# Patient Record
Sex: Female | Born: 1990 | Hispanic: Yes | Marital: Married | Smoking: Never smoker
Health system: Southern US, Community
[De-identification: ages and names within clinical notes are randomized; demographics above are authoritative.]

## PROBLEM LIST (undated history)

## (undated) ENCOUNTER — Inpatient Hospital Stay: Payer: Self-pay

---

## 2014-10-27 ENCOUNTER — Emergency Department
Admission: EM | Admit: 2014-10-27 | Discharge: 2014-10-27 | Disposition: A | Discharge status: Home / Self Care | Payer: Medicaid Other | Attending: Emergency Medicine | Admitting: Emergency Medicine

## 2014-10-27 ENCOUNTER — Emergency Department: Payer: Medicaid Other

## 2014-10-27 ENCOUNTER — Other Ambulatory Visit: Payer: Self-pay | Admitting: Advanced Practice Midwife

## 2014-10-27 DIAGNOSIS — O210 Mild hyperemesis gravidarum: Secondary | ICD-10-CM | POA: Diagnosis not present

## 2014-10-27 DIAGNOSIS — O219 Vomiting of pregnancy, unspecified: Secondary | ICD-10-CM

## 2014-10-27 DIAGNOSIS — Z3A09 9 weeks gestation of pregnancy: Secondary | ICD-10-CM | POA: Diagnosis not present

## 2014-10-27 LAB — URINALYSIS COMPLETE WITH MICROSCOPIC (ARMC ONLY)
Bilirubin Urine: NEGATIVE
Glucose, UA: NEGATIVE mg/dL
Hgb urine dipstick: NEGATIVE
LEUKOCYTES UA: NEGATIVE
NITRITE: NEGATIVE
Protein, ur: 100 mg/dL — AB
Specific Gravity, Urine: 1.03 (ref 1.005–1.030)
pH: 7 (ref 5.0–8.0)

## 2014-10-27 LAB — COMPREHENSIVE METABOLIC PANEL
ALT: 36 U/L (ref 14–54)
AST: 20 U/L (ref 15–41)
Albumin: 4.9 g/dL (ref 3.5–5.0)
Alkaline Phosphatase: 38 U/L (ref 38–126)
Anion gap: 10 (ref 5–15)
BILIRUBIN TOTAL: 1 mg/dL (ref 0.3–1.2)
BUN: 7 mg/dL (ref 6–20)
CO2: 28 mmol/L (ref 22–32)
Calcium: 9.7 mg/dL (ref 8.9–10.3)
Chloride: 99 mmol/L — ABNORMAL LOW (ref 101–111)
Creatinine, Ser: 0.53 mg/dL (ref 0.44–1.00)
GFR calc Af Amer: >60 mL/min (ref >60)
GFR calc non Af Amer: >60 mL/min (ref >60)
Glucose, Bld: 112 mg/dL — ABNORMAL HIGH (ref 65–99)
Potassium: 3.6 mmol/L (ref 3.5–5.1)
SODIUM: 137 mmol/L (ref 135–145)
Total Protein: 8.1 g/dL (ref 6.5–8.1)

## 2014-10-27 LAB — CBC
HCT: 40.9 % (ref 35.0–47.0)
HEMOGLOBIN: 14 g/dL (ref 12.0–16.0)
MCH: 30.8 pg (ref 26.0–34.0)
MCHC: 34.3 g/dL (ref 32.0–36.0)
MCV: 89.8 fL (ref 80.0–100.0)
Platelets: 222 10*3/uL (ref 150–440)
RBC: 4.56 MIL/uL (ref 3.80–5.20)
RDW: 12.8 % (ref 11.5–14.5)
WBC: 8.9 10*3/uL (ref 3.6–11.0)

## 2014-10-27 LAB — HCG, QUANTITATIVE, PREGNANCY: hCG, Beta Chain, Quant, S: 153476 m[IU]/mL — ABNORMAL HIGH (ref <5)

## 2014-10-27 LAB — MAGNESIUM: Magnesium: 1.9 mg/dL (ref 1.7–2.4)

## 2014-10-27 MED ORDER — SODIUM CHLORIDE 0.9 % IV BOLUS (SEPSIS)
1000.0000 mL | Freq: Once | INTRAVENOUS | Status: AC
  Administered 2014-10-27: 1000 mL via INTRAVENOUS

## 2014-10-27 MED ORDER — SODIUM CHLORIDE 0.9 % IV BOLUS (SEPSIS): 1000.0000 mL | INTRAVENOUS | Status: DC

## 2014-10-27 MED ORDER — ONDANSETRON HCL 4 MG/2ML IJ SOLN
4.0000 mg | INTRAMUSCULAR | Status: AC
  Administered 2014-10-27: 4 mg via INTRAVENOUS
  Filled 2014-10-27: qty 2

## 2014-10-27 MED ORDER — SODIUM CHLORIDE 0.9 % IV BOLUS (SEPSIS)
1000.0000 mL | INTRAVENOUS | Status: AC
  Administered 2014-10-27: 1000 mL via INTRAVENOUS

## 2014-10-27 MED ORDER — ONDANSETRON HCL 4 MG PO TABS: ORAL_TABLET | ORAL | Status: DC

## 2014-10-27 NOTE — Discharge Instructions (Signed)
Hiperemesis gravdica (Hyperemesis Gravidarum) La hiperemesis gravdica es una forma grave de nuseas y vmitos que ocurren durante el Psychiatristembarazo Es peor que las nuseas matutinas. Puede provocarle nuseas o vmitos todo el da, [address]urante varios das. Posiblemente provoque que no coma ni beba la cantidad suficiente de alimentos y lquidos. Generalmente ocurre durante la primera mitad (las primeras 20 semanas) de Psychiatristembarazo. En general mejora en la segunda mitad del embarazo. Pero en algunos casos continua durante todo el embarazo.  CAUSAS  La causa de esta afeccin no se conoce por completo, pero se cree que est relacionada con los Starwood Hotelscambios en las hormonas del organismo durante el [place] Mallembarazo. Se podra dar debido a un nivel alto de la hormona del Psychiatristembarazo o a un aumento de Stage managerestrgenos en el organismo.  SIGNOS Y SNTOMAS   Nuseas y vmitos intensos.  Nuseas no mejoran.  Vmitos que le impiden Comcastretener los alimentos.  Prdida de peso y de lquidos corporales (deshidratacin).  No tener deseos de comer ni tener agrado por los alimentos que antes disfrutaba. DIAGNSTICO  El mdico le preguntar acerca de sus sntomas y le har un examen fsico. Posiblemente tambin le indique anlisis de sangre y de [place] para asegurarse de que no haya otra causa del problema.  TRATAMIENTO  Es posible que nicamente necesite tomar algunos medicamentos para Wellsite geologistcontrolar el problema. Si los medicamentos no controlan las nuseas y los vmitos, recibir un Pharmacist, hospitaltratamiento en el hospital para prevenir la deshidratacin, un aumento de la acidez en la sangre (acidosis), la prdida de peso y las modificaciones electrolticas en el organismo que podran perjudicar al beb en gestacin (feto). Probablemente necesite recibir lquidos por va intravenosa (IV).  INSTRUCCIONES PARA EL CUIDADO EN EL HOGAR   Tome solo medicamentos de venta libre o recetados, segn las indicaciones del mdico.  Trate de comer algunas galletitas secas o tostadas  durante la Centuriamaana, antes de levantarse de la cama.  Evite los alimentos y los Electronics engineerolores que le desagradan.  Evite los alimentos grasos y muy condimentados.  Coma 5 o 6 comidas pequeas por da.  No beba mientras coma. Tome lquidos Altria Groupentre las comidas.  Para las colaciones, consuma alimentos ricos en protenas, como queso.  Coma o succione alimentos que contengan jengibre. El jengibre [employer] & Comejora las nuseas.  Evite preparar las comidas. El olor de la comida puede quitarle el apetito.  Evite los comprimidos de hierro y las multivitaminas que contengan hierro hasta despus del 3. o 4.mes de Psychiatristembarazo. No obstante, consulte con el mdico antes de dejar de tomar cualquier tipo de comprimido de hierro que le hayan recetado. SOLICITE ATENCIN MDICA SI:   El dolor abdominal aumenta.  Sufre un dolor intenso de Turkmenistancabeza.  Tiene problemas de visin.  Pierde peso. SOLICITE ATENCIN MDICA DE INMEDIATO SI:   No puede retener los lquidos.  Vomita sangre.  Tiene nuseas y vmitos constantes.  Se siente excesivamente dbil.  Siente sed extrema.  Tiene mareos o Newell Rubbermaiddesmayos.  Tiene fiebre o sntomas persistentes durante ms de 2a 3das.  Tiene fiebre y los sntomas empeoran repentinamente. ASEGRESE DE QUE:   Comprende estas instrucciones.  Controlar su afeccin.  Recibir ayuda de inmediato si no mejora o si empeora. Document Released: 03/06/2005 Document Revised: 03/11/2013 [facility] Patient Information 2015 [place]Care, [place]. This information is not intended to replace advice given to you by your health care provider. Make sure you discuss any questions you have with your health care provider.  Plan de alimentacin para la hiperemesis gravdica (Eating Plan for Hyperemesis  Gravidarum) °Los casos graves de hiperemesis gravídica pueden derivar en deshidratación y desnutrición. El plan de alimentación para la hiperemesis es una forma de disminuir los síntomas de náuseas y vómitos. A menudo  se utiliza con medicamentos recetados para controlar los síntomas.  °¿QUÉ PUEDO HACER PARA ALIVIAR MIS SÍNTOMAS? °Préstele atención a su cuerpo. Todas las personas son diferentes y tienen diferentes preferencias. Descubra qué funciona mejor para usted. Algunas de las siguientes opciones pueden ser de ayuda: °· Coma y beba lentamente. °· Realice 5 o 6 comidas pequeñas por día en vez de tres grandes. °· Consuma galletitas saladas antes de levantarse por la mañana. °· Los alimentos que contienen almidón normalmente se toleran bien, como cereales, tostadas, pan, papas, pasta, arroz y pretzels. °· El jengibre también es útil para evitar las náuseas. Añada ¼ de cucharadita de jengibre molido al té caliente o beba té de jengibre. °· Intente tomar jugo 100 % de frutas o una bebida de electrolitos. °· Continúe tomando las vitaminas prenatales según las indicaciones del médico. Si tiene problemas para tomas sus vitaminas prenatales, hable con su médico sobre las diferentes opciones. °· Incluya al menos una porción de proteínas con las comidas y las colaciones (como carne de vaca o de ave, frijoles, frutos secos, huevos o yogur). Intente comer una colación rica en proteínas antes de irse a dormir (como queso y galletas o medio sándwich de mantequilla de maní o pavo). °¿QUÉ DEBO EVITAR PARA REDUCIR MIS SÍNTOMAS? °Las siguientes opciones pueden ser de ayuda para reducir sus síntomas: °· Evite los alimentos con olores fuertes. Intente comer en áreas bien ventiladas libres de olores. °· Evite tomar agua u otras bebidas con las comidas. Intente no beber nada 30 minutos antes y después de las comidas. °· Evite beber más de una taza de líquidos a la vez. °· Evite las comidas fritas o con gran contenido de grasas, como salsas con manteca y crema. °· Evite los alimentos muy condimentados. °· Evite saltear comidas tanto como sea posible. Las náuseas pueden ser más intensas con el estómago vacío. Si no puede tolerar los alimentos en ese  momento, no se obligue. Intente chupar trozos de hielo u otros elementos congelados e ingiera las calorías más tarde. °· Evite recostarse durante las dos horas siguientes a comer. °Document Released: 06/22/2008 Document Revised: 03/11/2013 °ExitCare® Patient Information ©2015 ExitCare, LLC. This information is not intended to replace advice given to you by your health care provider. Make sure you discuss any questions you have with your health care provider. ° °

## 2014-10-27 NOTE — ED Notes (Signed)
Patient has been vomiting everyday for about a month and cannot keep anything down. Patient is pregnant about 10-12 weeks per bimanual exam at health department.

## 2014-10-27 NOTE — ED Notes (Signed)
Ginger ale and saltines given to patient for PO challenge.

## 2014-10-27 NOTE — ED Notes (Signed)
Patient tolerated PO Challenge. No N/V

## 2014-10-27 NOTE — ED Notes (Signed)
AAOx3.  Skin warm and dry.  NAD.  D/C home 

## 2014-10-27 NOTE — ED Notes (Signed)
AAOx3.  Skin warm and dry.  NAD 

## 2014-10-27 NOTE — ED Provider Notes (Addendum)
[facility] Emergency Department Provider Note  ____________________________________________  Time seen: Approximately 7:38 PM  I have reviewed the triage vital signs and the nursing notes.   HISTORY  Chief Complaint Nausea and Emesis During Pregnancy   HPI Melanie Dominguez is a 24 y.o. female with no significant past medical history.  She is approximately 10-[redacted] weeks pregnant based on her examination at the health Department but has not had an ultrasound.  This is her first pregnancy.  Results with persistent severe emesis.  She states she has been vomiting every day for about one month.  She has not had any medication during this time.  Reportedly as an outpatient she had ketones in her urine.  Her symptoms are severe.  She denies abdominal pain, vaginal bleeding, and dysuria.  She denies fever/chills.  History reviewed. No pertinent past medical history.  There are no active problems to display for this patient.   History reviewed. No pertinent past surgical history.  Current Outpatient Rx  Name  Route  Sig  Dispense  Refill  . ondansetron (ZOFRAN) 4 MG tablet      Take 1-2 tabs by mouth every 8 hours as needed for nausea/vomiting   30 tablet   0     Allergies Review of patient's allergies indicates no known allergies.  No family history on file.  Social History History  Substance Use Topics  . Smoking status: Never Smoker   . Smokeless tobacco: Never Used  . Alcohol Use: No    Review of Systems Constitutional: No fever/chills Eyes: No visual changes. ENT: No sore throat. Cardiovascular: Denies chest pain. Respiratory: Denies shortness of breath. Gastrointestinal: No abdominal pain.  Persistent vomiting for about 1 month.  No diarrhea.  No constipation. Genitourinary: Negative for dysuria. Musculoskeletal: Negative for back pain. Skin: Negative for rash. Neurological: Negative for headaches, focal weakness or numbness.  10-point  ROS otherwise negative.  ____________________________________________   PHYSICAL EXAM:  VITAL SIGNS: ED Triage Vitals  Enc Vitals Group     BP 10/27/14 1720 130/77 mmHg     Pulse Rate 10/27/14 1720 98     Resp 10/27/14 1720 16     Temp 10/27/14 1720 98 F (36.7 C)     Temp Source 10/27/14 1720 Oral     SpO2 10/27/14 1720 100 %     Weight 10/27/14 1720 130 lb (58.968 kg)     Height 10/27/14 1720 5' 1.02" (1.55 m)     Head Cir --      Peak Flow --      Pain Score --      Pain Loc --      Pain Edu? --      Excl. in GC? --     Constitutional: Alert and oriented. Well appearing and in no acute distress. Eyes: Conjunctivae are normal. PERRL. EOMI. Head: Atraumatic. Nose: No congestion/rhinnorhea. Mouth/Throat: Mucous membranes are moist.  Oropharynx non-erythematous. Neck: No stridor.   Cardiovascular: Normal rate, regular rhythm. Grossly normal heart sounds.  Good peripheral circulation. Respiratory: Normal respiratory effort.  No retractions. Lungs CTAB. Gastrointestinal: Soft and nontender. No distention. No abdominal bruits. No CVA tenderness. Musculoskeletal: No lower extremity tenderness nor edema.  No joint effusions. Neurologic:  Normal speech and language. No gross focal neurologic deficits are appreciated.  Skin:  Skin is warm, dry and intact. No rash noted. Psychiatric: Mood and affect are normal. Speech and behavior are normal.  ____________________________________________   LABS (all labs ordered are listed,  but only abnormal results are displayed)  Labs Reviewed  COMPREHENSIVE METABOLIC PANEL - Abnormal; Notable for the following:    Chloride 99 (*)    Glucose, Bld 112 (*)    All other components within normal limits  HCG, QUANTITATIVE, PREGNANCY - Abnormal; Notable for the following:    hCG, Beta Chain, Quant, [place] 119147 (*)    All other components within normal limits  URINALYSIS COMPLETEWITH MICROSCOPIC ([facility] ONLY) - Abnormal; Notable for the following:     Color, Urine YELLOW (*)    APPearance CLEAR (*)    Ketones, ur 2+ (*)    Protein, ur 100 (*)    Bacteria, UA RARE (*)    Squamous Epithelial / LPF 0-5 (*)    All other components within normal limits  CBC  MAGNESIUM   2+ ketones in urine ____________________________________________  EKG  Not indicated ____________________________________________  RADIOLOGY  US Ob Comp Less 14 Wks  10/27/2014   CLINICAL DATA:  Severe nausea and vomiting.  Early pregnancy.  EXAM: OBSTETRIC <14 WK ULTRASOUND  TECHNIQUE: Transabdominal ultrasound was performed for evaluation of the gestation as well as the maternal uterus and adnexal regions.  COMPARISON:  None.  FINDINGS: Intrauterine gestational sac: Visualized/normal in shape.  Yolk sac:  Present  Embryo:  Present  Cardiac Activity: Present  Heart Rate: 173 bpm  CRL:   25  mm   9 w 1 d                  [place] EDC: 05/31/2015.  Maternal uterus/adnexae: Physiologic appearance of the ovaries. Negative for subchorionic hemorrhage. No free fluid.  IMPRESSION: Uncomplicated single intrauterine pregnancy. Transabdominal scanning was performed. Patient refused transvaginal scanning.   Electronically Signed   By: Andreas Newport M.D.   On: 10/27/2014 21:31    ____________________________________________   PROCEDURES  Procedure(s) performed: None  Critical Care performed: No ____________________________________________   INITIAL IMPRESSION / ASSESSMENT AND PLAN / ED COURSE  Pertinent labs & imaging results that were available during my care of the patient were reviewed by me and considered in my medical decision making (see chart for details).  Patient is having persistent vomiting during early pregnancy consistent with hyperemesis.  However her vital signs are reassuring with only slight borderline tachycardia.  Her labs are unremarkable except for 2+ ketones in her urine.  She has no evidence of urinary tract infection.  I am providing 2 L of normal  saline IV fluid bolus.  I also obtained an ultrasound since she has not had imaging and I wanted to rule out the possibility of a molar pregnancy.  She has a single IUP at approximately [redacted] weeks gestation.  I updated the patient and her husband about all of these findings.  She is feeling better after IV Zofran in the emergency department.  We will try by mouth challenge I will prescribe her Zofran as an outpatient.  She will follow up either with the health department or with Dr. Dalbert Garnet at the [facility].  I gave her my usual customary return precautions.  ____________________________________________  FINAL CLINICAL IMPRESSION(S) / ED DIAGNOSES  Final diagnoses:  Nausea and vomiting during pregnancy prior to [redacted] weeks gestation  Hyperemesis gravidarum      NEW MEDICATIONS STARTED DURING THIS VISIT:  New Prescriptions   ONDANSETRON (ZOFRAN) 4 MG TABLET    Take 1-2 tabs by mouth every 8 hours as needed for nausea/vomiting     Loleta Rose, MD 10/27/14 2151  Kandee Keen  York Cerise, MD 10/27/14 2219

## 2014-10-29 ENCOUNTER — Ambulatory Visit: Payer: Self-pay

## 2014-12-09 ENCOUNTER — Observation Stay
Admission: EM | Admit: 2014-12-09 | Discharge: 2014-12-13 | Disposition: A | Discharge status: Home / Self Care | Payer: Medicaid Other | Attending: Obstetrics and Gynecology | Admitting: Obstetrics and Gynecology

## 2014-12-09 DIAGNOSIS — Z79899 Other long term (current) drug therapy: Secondary | ICD-10-CM | POA: Insufficient documentation

## 2014-12-09 DIAGNOSIS — O210 Mild hyperemesis gravidarum: Secondary | ICD-10-CM | POA: Diagnosis present

## 2014-12-09 DIAGNOSIS — R05 Cough: Secondary | ICD-10-CM | POA: Insufficient documentation

## 2014-12-09 DIAGNOSIS — N390 Urinary tract infection, site not specified: Secondary | ICD-10-CM | POA: Diagnosis not present

## 2014-12-09 DIAGNOSIS — R7989 Other specified abnormal findings of blood chemistry: Secondary | ICD-10-CM | POA: Insufficient documentation

## 2014-12-09 DIAGNOSIS — R17 Unspecified jaundice: Secondary | ICD-10-CM

## 2014-12-09 DIAGNOSIS — Z3A15 15 weeks gestation of pregnancy: Secondary | ICD-10-CM | POA: Insufficient documentation

## 2014-12-09 DIAGNOSIS — O211 Hyperemesis gravidarum with metabolic disturbance: Secondary | ICD-10-CM | POA: Diagnosis not present

## 2014-12-09 DIAGNOSIS — R1013 Epigastric pain: Secondary | ICD-10-CM | POA: Diagnosis not present

## 2014-12-09 DIAGNOSIS — R740 Nonspecific elevation of levels of transaminase and lactic acid dehydrogenase [LDH]: Secondary | ICD-10-CM | POA: Insufficient documentation

## 2014-12-09 DIAGNOSIS — R748 Abnormal levels of other serum enzymes: Secondary | ICD-10-CM

## 2014-12-09 DIAGNOSIS — E860 Dehydration: Secondary | ICD-10-CM | POA: Diagnosis not present

## 2014-12-09 DIAGNOSIS — E876 Hypokalemia: Secondary | ICD-10-CM

## 2014-12-09 DIAGNOSIS — O219 Vomiting of pregnancy, unspecified: Secondary | ICD-10-CM

## 2014-12-09 LAB — CBC
HCT: 45.8 % (ref 35.0–47.0)
Hemoglobin: 15.7 g/dL (ref 12.0–16.0)
MCH: 30.1 pg (ref 26.0–34.0)
MCHC: 34.3 g/dL (ref 32.0–36.0)
MCV: 87.5 fL (ref 80.0–100.0)
Platelets: 340 10*3/uL (ref 150–440)
RBC: 5.23 MIL/uL — ABNORMAL HIGH (ref 3.80–5.20)
RDW: 13 % (ref 11.5–14.5)
WBC: 13.5 10*3/uL — AB (ref 3.6–11.0)

## 2014-12-09 LAB — COMPREHENSIVE METABOLIC PANEL
ALBUMIN: 5 g/dL (ref 3.5–5.0)
ALT: 126 U/L — AB (ref 14–54)
AST: 77 U/L — AB (ref 15–41)
Alkaline Phosphatase: 54 U/L (ref 38–126)
Anion gap: 19 — ABNORMAL HIGH (ref 5–15)
BUN: 22 mg/dL — AB (ref 6–20)
CHLORIDE: 90 mmol/L — AB (ref 101–111)
CO2: 25 mmol/L (ref 22–32)
Calcium: 10.5 mg/dL — ABNORMAL HIGH (ref 8.9–10.3)
Creatinine, Ser: 0.92 mg/dL (ref 0.44–1.00)
GFR calc Af Amer: >60 mL/min (ref >60)
Glucose, Bld: 111 mg/dL — ABNORMAL HIGH (ref 65–99)
Potassium: 2.9 mmol/L — CL (ref 3.5–5.1)
Sodium: 134 mmol/L — ABNORMAL LOW (ref 135–145)
Total Bilirubin: 6.2 mg/dL — ABNORMAL HIGH (ref 0.3–1.2)
Total Protein: 8.9 g/dL — ABNORMAL HIGH (ref 6.5–8.1)

## 2014-12-09 LAB — URINALYSIS COMPLETE WITH MICROSCOPIC (ARMC ONLY)
Glucose, UA: NEGATIVE mg/dL
NITRITE: NEGATIVE
PH: 5 (ref 5.0–8.0)
PROTEIN: 30 mg/dL — AB
Specific Gravity, Urine: 1.012 (ref 1.005–1.030)

## 2014-12-09 LAB — BILIRUBIN, FRACTIONATED(TOT/DIR/INDIR)
BILIRUBIN INDIRECT: 2.7 mg/dL — AB (ref 0.3–0.9)
BILIRUBIN TOTAL: 5.8 mg/dL — AB (ref 0.3–1.2)
Bilirubin, Direct: 3.1 mg/dL — ABNORMAL HIGH (ref 0.1–0.5)

## 2014-12-09 LAB — LIPASE, BLOOD: Lipase: 74 U/L — ABNORMAL HIGH (ref 22–51)

## 2014-12-09 MED ORDER — ONDANSETRON HCL 4 MG/2ML IJ SOLN
INTRAMUSCULAR | Status: AC
  Administered 2014-12-09: 4 mg via INTRAVENOUS
  Filled 2014-12-09: qty 2

## 2014-12-09 MED ORDER — DEXTROSE-NACL 5-0.45 % IV SOLN
INTRAVENOUS | Status: DC
  Administered 2014-12-09: 18:00:00 via INTRAVENOUS

## 2014-12-09 MED ORDER — ONDANSETRON HCL 4 MG/2ML IJ SOLN
4.0000 mg | Freq: Once | INTRAMUSCULAR | Status: AC | PRN
  Administered 2014-12-09: 4 mg via INTRAVENOUS

## 2014-12-09 MED ORDER — METOCLOPRAMIDE HCL 5 MG/ML IJ SOLN
10.0000 mg | Freq: Once | INTRAMUSCULAR | Status: AC
  Administered 2014-12-09: 10 mg via INTRAVENOUS
  Filled 2014-12-09 (×2): qty 2

## 2014-12-09 MED ORDER — DEXTROSE-NACL 5-0.45 % IV SOLN
INTRAVENOUS | Status: DC
  Administered 2014-12-09 – 2014-12-10 (×2): via INTRAVENOUS

## 2014-12-09 MED ORDER — POTASSIUM CHLORIDE CRYS ER 20 MEQ PO TBCR
40.0000 meq | EXTENDED_RELEASE_TABLET | Freq: Once | ORAL | Status: AC
  Administered 2014-12-09: 40 meq via ORAL
  Filled 2014-12-09 (×2): qty 2

## 2014-12-09 MED ORDER — ONDANSETRON HCL 4 MG/2ML IJ SOLN: 4.0000 mg | Freq: Four times a day (QID) | INTRAMUSCULAR | Status: DC | PRN

## 2014-12-09 MED ORDER — METOCLOPRAMIDE HCL 5 MG/ML IJ SOLN: 10.0000 mg | Freq: Four times a day (QID) | INTRAMUSCULAR | Status: DC | PRN

## 2014-12-09 NOTE — ED Provider Notes (Signed)
[facility] Emergency Department Provider Note  ____________________________________________  Time seen: Approximately 5 PM  I have reviewed the triage vital signs and the nursing notes. Interpreter service is utilized,Jacqui  HISTORY  Chief Complaint Emesis and Abdominal Pain    HPI Melanie Dominguez is a 24 y.o. female who is a G1 P0 at [redacted] weeks pregnant who is presenting today with hyperemesis gravidarum from the [facility] health Department. The patient has lost 6 kg over the last several weeks. She has vomited up to 15 times per day. No bilious vomiting. No diarrhea. Also saying she has a cough over the last several days. No fevers at home. Denies any vaginal bleeding or discharge. Denies any abdominal pain at this time but says she was having some upper abdominal pain. Has been using Zofran but ran out of the past 8 days and vomiting has been worse. Said that even with the Zofran she was still having nausea and vomiting. Denies any burning with urination.   No past medical history on file.  There are no active problems to display for this patient.   No past surgical history on file.  Current Outpatient Rx  Name  Route  Sig  Dispense  Refill  . ondansetron (ZOFRAN) 4 MG tablet      Take 1-2 tabs by mouth every 8 hours as needed for nausea/vomiting   30 tablet   0     Allergies Review of patient's allergies indicates no known allergies.  No family history on file.  Social History Social History  Substance Use Topics  . Smoking status: Never Smoker   . Smokeless tobacco: Never Used  . Alcohol Use: No    Review of Systems Constitutional: No fever/chills Eyes: No visual changes. ENT: No sore throat. Cardiovascular: Denies chest pain. Respiratory: Denies shortness of breath. Gastrointestinal: No diarrhea.  No constipation. Genitourinary: Negative for dysuria. Musculoskeletal: Negative for back pain. Skin: Negative for  rash. Neurological: Negative for headaches, focal weakness or numbness.  10-point ROS otherwise negative.  ____________________________________________   PHYSICAL EXAM:  VITAL SIGNS: ED Triage Vitals  Enc Vitals Group     BP 12/09/14 1558 102/69 mmHg     Pulse Rate 12/09/14 1558 101     Resp 12/09/14 1558 18     Temp 12/09/14 1558 97.7 F (36.5 C)     Temp Source 12/09/14 1558 Oral     SpO2 12/09/14 1558 98 %     Weight 12/09/14 1607 116 lb (52.617 kg)     Height 12/09/14 1607 5' (1.524 m)     Head Cir --      Peak Flow --      Pain Score 12/09/14 1607 5     Pain Loc --      Pain Edu? --      Excl. in GC? --     Constitutional: Alert and oriented. Well appearing and in no acute distress. Eyes: Positive for scleral icterus. PERRL. EOMI. Head: Atraumatic. Nose: No congestion/rhinnorhea. Mouth/Throat: Mucous membranes are moist.  Oropharynx non-erythematous. Neck: No stridor.   Cardiovascular: Normal rate, regular rhythm. Grossly normal heart sounds.  Good peripheral circulation. Respiratory: Normal respiratory effort.  No retractions. Lungs CTAB. Gastrointestinal: Soft and nontender. No abdominal bruits. No CVA tenderness. Musculoskeletal: No lower extremity tenderness nor edema.  No joint effusions. Neurologic:  Normal speech and language. No gross focal neurologic deficits are appreciated. No gait instability. Skin:  Skin is warm, dry and intact. No rash noted.  Psychiatric: Mood and affect are normal. Speech and behavior are normal.  ____________________________________________   LABS (all labs ordered are listed, but only abnormal results are displayed)  Labs Reviewed  COMPREHENSIVE METABOLIC PANEL - Abnormal; Notable for the following:    Sodium 134 (*)    Potassium 2.9 (*)    Chloride 90 (*)    Glucose, Bld 111 (*)    BUN 22 (*)    Calcium 10.5 (*)    Total Protein 8.9 (*)    AST 77 (*)    ALT 126 (*)    Total Bilirubin 6.2 (*)    Anion gap 19 (*)     All other components within normal limits  CBC - Abnormal; Notable for the following:    WBC 13.5 (*)    RBC 5.23 (*)    All other components within normal limits  LIPASE, BLOOD - Abnormal; Notable for the following:    Lipase 74 (*)    All other components within normal limits  URINALYSIS COMPLETEWITH MICROSCOPIC ([facility] ONLY) - Abnormal; Notable for the following:    Color, Urine AMBER (*)    APPearance HAZY (*)    Bilirubin Urine 1+ (*)    Ketones, ur 1+ (*)    Hgb urine dipstick 1+ (*)    Protein, ur 30 (*)    Leukocytes, UA TRACE (*)    Bacteria, UA MANY (*)    Squamous Epithelial / LPF 0-5 (*)    All other components within normal limits   ____________________________________________  EKG   ____________________________________________  RADIOLOGY   ____________________________________________   PROCEDURES   ____________________________________________   INITIAL IMPRESSION / ASSESSMENT AND PLAN / ED COURSE  Pertinent labs & imaging results that were available during my care of the patient were reviewed by me and considered in my medical decision making (see chart for details).  ----------------------------------------- 5:36 PM on 12/09/2014 -----------------------------------------  Multiple electrolyte abnormalities. Discussed case with Dr.Staebler of OB/GYN who is in the emergency department examining the patient at this time and is recommending admission to the hospital. ____________________________________________   FINAL CLINICAL IMPRESSION(S) / ED DIAGNOSES  Acute hyperemesis gravidarum. Acute hypokalemia. Acute dehydration. Acute hyper limited anemia. Initial visit.    Myrna Blazer, MD 12/09/14 [phone]

## 2014-12-09 NOTE — ED Notes (Signed)
Dr. Seabron Spates notified of critical k-dur of 2.9

## 2014-12-09 NOTE — H&P (Signed)
Obstetrics & Gynecology Consult H&P    Consulting Department: Emergency Department  Consulting Physician: Orbie Pyo, MD   Consulting Question: Hyperemesis Gravidarum   History of Present Illness: Patient is a 24 y.o. G1P0 at 32w2dtoday 9 week [place] 05/31/2015 with hyperemesis.  16.4lbs weight loss since 11/20/2014, UA at [facility] with 4+ ketones, 3+ proteinuria, 3+ bilirubin with signs of dehydration and jaundice at her routine prenatal visit today.  The patient has had problems with nausea throughout the pregnancy but acute worsening over the past 8 days.  Reports 16 episodes of emesis yesterday, 8 episodes today.  Some epigastric pain, no RUQ or back pain.  Denies loose stools.  No fevers, no sick contacts.  Interestingly the patient does reports episodes of jaundice if she goes long periods fasting.  No vaginal bleeding, fetal movement, LOF.    Review of Systems:10 point review of systems  Past Medical History:  No past medical history on file.  Past Surgical History:  No past surgical history on file.  Obstetric History: G1P0  Family History:  No family history on file.  Social History:  Social History   Social History  . Marital Status: Single    Spouse Name: N/A  . Number of Children: N/A  . Years of Education: N/A   Occupational History  . Not on file.   Social History Main Topics  . Smoking status: Never Smoker   . Smokeless tobacco: Never Used  . Alcohol Use: No  . Drug Use: No  . Sexual Activity: Not on file   Other Topics Concern  . Not on file   Social History Narrative  . No narrative on file    Allergies:  No Known Allergies  Medications: Prior to Admission medications   Medication Sig Start Date End Date Taking? Authorizing Provider  ondansetron (ZOFRAN) 4 MG tablet Take 1-2 tabs by mouth every 8 hours as needed for nausea/vomiting 10/27/14   CHinda Kehr MD    Physical Exam Vitals: Blood pressure 102/69, pulse 101,  temperature 97.7 F (36.5 C), temperature source Oral, resp. rate 18, height 5' (1.524 m), weight 52.617 kg (116 lb), last menstrual period 08/31/2014, SpO2 98 %.  General: NAD HEENT: Normocephalic, scleral icterus, slight thryomegaly Pulmonary: CTAB Cardiovascular: RRR Abdomen: Hyperactive BS, soft, non-tender, non-distended FHT 155 Genitourinary: deferred Extremities: no edema, decreased turgor pressure Neurologic: CN II-XII grossly intact Psychiatric: A&O x 3, mood appropriate, affect full  Labs: Results for orders placed or performed during the hospital encounter of 12/09/14 (from the past 72 hour(s))  Comprehensive metabolic panel     Status: Abnormal   Collection Time: 12/09/14  4:12 PM  Result Value Ref Range   Sodium 134 (L) 135 - 145 mmol/L   Potassium 2.9 (LL) 3.5 - 5.1 mmol/L    Comment: CRITICAL RESULT CALLED TO, READ BACK BY AND VERIFIED WITH OLIVIA BROOMER @ 1700 ON 12/09/2014 BY CAF    Chloride 90 (L) 101 - 111 mmol/L   CO2 25 22 - 32 mmol/L   Glucose, Bld 111 (H) 65 - 99 mg/dL   BUN 22 (H) 6 - 20 mg/dL   Creatinine, Ser 0.92 0.44 - 1.00 mg/dL   Calcium 10.5 (H) 8.9 - 10.3 mg/dL   Total Protein 8.9 (H) 6.5 - 8.1 g/dL   Albumin 5.0 3.5 - 5.0 g/dL   AST 77 (H) 15 - 41 U/L   ALT 126 (H) 14 - 54 U/L   Alkaline Phosphatase 54 38 -  126 U/L   Total Bilirubin 6.2 (H) 0.3 - 1.2 mg/dL   GFR calc non Af Amer >60 >60 mL/min   GFR calc Af Amer >60 >60 mL/min    Comment: (NOTE) The eGFR has been calculated using the CKD EPI equation. This calculation has not been validated in all clinical situations. eGFR's persistently <60 mL/min signify possible Chronic Kidney Disease.    Anion gap 19 (H) 5 - 15  CBC     Status: Abnormal   Collection Time: 12/09/14  4:12 PM  Result Value Ref Range   WBC 13.5 (H) 3.6 - 11.0 K/uL   RBC 5.23 (H) 3.80 - 5.20 MIL/uL   Hemoglobin 15.7 12.0 - 16.0 g/dL   HCT 45.8 35.0 - 47.0 %   MCV 87.5 80.0 - 100.0 fL   MCH 30.1 26.0 - 34.0 pg   MCHC  34.3 32.0 - 36.0 g/dL   RDW 13.0 11.5 - 14.5 %   Platelets 340 150 - 440 K/uL  Lipase, blood     Status: Abnormal   Collection Time: 12/09/14  4:12 PM  Result Value Ref Range   Lipase 74 (H) 22 - 51 U/L  Urinalysis complete, with microscopic ([facility] only)     Status: Abnormal   Collection Time: 12/09/14  4:12 PM  Result Value Ref Range   Color, Urine AMBER (A) YELLOW   APPearance HAZY (A) CLEAR   Glucose, UA NEGATIVE NEGATIVE mg/dL   Bilirubin Urine 1+ (A) NEGATIVE   Ketones, ur 1+ (A) NEGATIVE mg/dL   Specific Gravity, Urine 1.012 1.005 - 1.030   Hgb urine dipstick 1+ (A) NEGATIVE   pH 5.0 5.0 - 8.0   Protein, ur 30 (A) NEGATIVE mg/dL   Nitrite NEGATIVE NEGATIVE   Leukocytes, UA TRACE (A) NEGATIVE   RBC / HPF 0-5 0 - 5 RBC/hpf   WBC, UA 6-30 0 - 5 WBC/hpf   Bacteria, UA MANY (A) NONE SEEN   Squamous Epithelial / LPF 0-5 (A) NONE SEEN   Mucous PRESENT    Hyaline Casts, UA PRESENT     Imaging No results found.  Assessment: 24 y.o. G1P0 with hyperemesis gravidarum with electrolyte disturbance  Plan: 1) Hyperemesis gravidarum - IV hydration - IV antiemetics - potassium repleted in ER (71mq of K-dur) will recheck later this evening - will check TSH with repeat lytes this evening  2) Fetus - previable FHT 155  3) Elevated LFT's - maybe secondary severity of hyperemesis, underlying viral gastroenterology, vs potential Gilbert's disease - will recheck in AM after IV hydration and improvement in emesis - lipase pending but no RUQ pain/negative murphy's sign  4) FEN - NPO, D5 1/2 NS  5) Disposition - anticipated LOS 1-2 days, pending clinical and laboratory improvement

## 2014-12-09 NOTE — ED Notes (Addendum)
Pt ambulatory to triage with reports that she has been vomiting x 8 days- reports that she is approx [redacted] weeks pregnant. Was sent by clinic for hyperemesis gravidarum.

## 2014-12-10 ENCOUNTER — Observation Stay: Payer: Medicaid Other

## 2014-12-10 DIAGNOSIS — R1110 Vomiting, unspecified: Secondary | ICD-10-CM

## 2014-12-10 DIAGNOSIS — Z331 Pregnant state, incidental: Secondary | ICD-10-CM

## 2014-12-10 DIAGNOSIS — R17 Unspecified jaundice: Secondary | ICD-10-CM | POA: Diagnosis not present

## 2014-12-10 LAB — COMPREHENSIVE METABOLIC PANEL
ALBUMIN: 3.6 g/dL (ref 3.5–5.0)
ALK PHOS: 42 U/L (ref 38–126)
ALT: 98 U/L — ABNORMAL HIGH (ref 14–54)
ANION GAP: 7 (ref 5–15)
AST: 60 U/L — AB (ref 15–41)
BUN: 9 mg/dL (ref 6–20)
CALCIUM: 8.9 mg/dL (ref 8.9–10.3)
CO2: 27 mmol/L (ref 22–32)
Chloride: 99 mmol/L — ABNORMAL LOW (ref 101–111)
Creatinine, Ser: 0.47 mg/dL (ref 0.44–1.00)
GFR calc Af Amer: >60 mL/min (ref >60)
GFR calc non Af Amer: >60 mL/min (ref >60)
GLUCOSE: 120 mg/dL — AB (ref 65–99)
POTASSIUM: 2.4 mmol/L — AB (ref 3.5–5.1)
SODIUM: 133 mmol/L — AB (ref 135–145)
Total Bilirubin: 4.1 mg/dL — ABNORMAL HIGH (ref 0.3–1.2)
Total Protein: 6.5 g/dL (ref 6.5–8.1)

## 2014-12-10 LAB — HEPATIC FUNCTION PANEL
ALBUMIN: 3.6 g/dL (ref 3.5–5.0)
ALK PHOS: 39 U/L (ref 38–126)
ALT: 103 U/L — AB (ref 14–54)
AST: 70 U/L — ABNORMAL HIGH (ref 15–41)
Bilirubin, Direct: 1.9 mg/dL — ABNORMAL HIGH (ref 0.1–0.5)
Indirect Bilirubin: 1.6 mg/dL — ABNORMAL HIGH (ref 0.3–0.9)
TOTAL PROTEIN: 6.4 g/dL — AB (ref 6.5–8.1)
Total Bilirubin: 3.5 mg/dL — ABNORMAL HIGH (ref 0.3–1.2)

## 2014-12-10 LAB — TSH: TSH: 1.265 u[IU]/mL (ref 0.350–4.500)

## 2014-12-10 LAB — T4, FREE: Free T4: 1.09 ng/dL (ref 0.61–1.12)

## 2014-12-10 MED ORDER — DEXTROSE 5 % IV SOLN
2.0000 g | Freq: Four times a day (QID) | INTRAVENOUS | Status: DC
  Administered 2014-12-10 – 2014-12-11 (×4): 2 g via INTRAVENOUS
  Filled 2014-12-10 (×9): qty 2

## 2014-12-10 MED ORDER — FAMOTIDINE IN NACL 20-0.9 MG/50ML-% IV SOLN
20.0000 mg | Freq: Two times a day (BID) | INTRAVENOUS | Status: DC
  Administered 2014-12-10 – 2014-12-11 (×3): 20 mg via INTRAVENOUS
  Filled 2014-12-10 (×3): qty 50

## 2014-12-10 MED ORDER — KCL IN DEXTROSE-NACL 30-5-0.45 MEQ/L-%-% IV SOLN
INTRAVENOUS | Status: DC
  Administered 2014-12-10 – 2014-12-11 (×4): via INTRAVENOUS
  Filled 2014-12-10 (×12): qty 1000

## 2014-12-10 NOTE — Consult Note (Signed)
[facility]  [address] [place], [place] [address] Phone: [phone] Fax : [phone]  Consultation  Referring Provider:     No ref. provider found Primary Care Physician:  Darlina Rumpf, MD Primary Gastroenterologist:  None         Reason for Consultation:     Abnormal liver enzymes  Date of Admission:  12/09/2014 Date of Consultation:  12/10/2014         HPI:   Melanie Dominguez is a 24 y.o. female who was admitted with hyperemesis. The patient is [redacted] weeks pregnant and was admitted for hyperemesis. The patient has had a 16 pound weight loss since September 2. The patient was noted to have ketones protein and bilirubin in her urine and was dehydrated. The patient was admitted to the hospital and found to have abnormal liver enzymes. On admission the patient's bilirubin was 6 which came down to 5.8 yesterday was down to 4.1 today. One month ago the liver enzymes were normal. The patient's AST and ALT were 77 and 126 respectively yesterday with her AST of 60 with a ALT of 98 today. The patient denies any sick contacts. She also denies any anti-inflammatory medication or Tylenol intake. She denies any joint pain fevers or chills. She does report some abdominal pain related to her nausea and vomiting. She also reports that she is feeling much better since she was given medication for nausea and vomiting.  History reviewed. No pertinent past medical history.  History reviewed. No pertinent past surgical history.  Prior to Admission medications   Medication Sig Start Date End Date Taking? Authorizing Provider  ondansetron (ZOFRAN) 4 MG tablet Take 1-2 tabs by mouth every 8 hours as needed for nausea/vomiting 10/27/14  Yes Loleta Rose, MD    History reviewed. No pertinent family history.   Social History  Substance Use Topics  . Smoking status: Never Smoker   . Smokeless tobacco: Never Used  . Alcohol Use: No    Allergies as of 12/09/2014  . (No Known  Allergies)    Review of Systems:    All systems reviewed and negative except where noted in HPI.   Physical Exam:  Vital signs in last 24 hours: Temp:  [97.9 F (36.6 C)-98.6 F (37 C)] 98.4 F (36.9 C) (09/22 1655) Pulse Rate:  [82-101] 88 (09/22 1655) Resp:  [16-18] 16 (09/22 1655) BP: (104-136)/(59-90) 124/84 mmHg (09/22 1655) SpO2:  [95 %-100 %] 99 % (09/22 1100) Weight:  [117 lb (53.071 kg)] 117 lb (53.071 kg) (09/21 1945)   General:   Pleasant, cooperative in NAD Head:  Normocephalic and atraumatic. Eyes:   No icterus.   Conjunctiva pink. PERRLA. Ears:  Normal auditory acuity. Neck:  Supple; no masses or thyroidomegaly Lungs: Respirations even and unlabored. Lungs clear to auscultation bilaterally.   No wheezes, crackles, or rhonchi.  Heart:  Regular rate and rhythm;  Without murmur, clicks, rubs or gallops Abdomen:  Soft, nondistended, nontender. Normal bowel sounds. No appreciable masses or hepatomegaly.  No rebound or guarding. Gravid Rectal:  Not performed. Msk:  Symmetrical without gross deformities.  Strength  Extremities:  Without edema, cyanosis or clubbing. Neurologic:  Alert and oriented x3;  grossly normal neurologically. Skin:  Intact without significant lesions or rashes. Cervical Nodes:  No significant cervical adenopathy. Psych:  Alert and cooperative. Normal affect.  LAB RESULTS:  Recent Labs  12/09/14 1612  WBC 13.5*  HGB 15.7  HCT 45.8  PLT 340   BMET  Recent Labs  12/09/14 1612 12/10/14 0629  NA 134* 133*  K 2.9* 2.4*  CL 90* 99*  CO2 25 27  GLUCOSE 111* 120*  BUN 22* 9  CREATININE 0.92 0.47  CALCIUM 10.5* 8.9   LFT  Recent Labs  12/09/14 1623 12/10/14 0629  PROT  --  6.5  ALBUMIN  --  3.6  AST  --  60*  ALT  --  98*  ALKPHOS  --  42  BILITOT 5.8* 4.1*  BILIDIR 3.1*  --   IBILI 2.7*  --    PT/INR No results for input(s): LABPROT, INR in the last 72 hours.  STUDIES: US Abdomen Complete  12/10/2014   CLINICAL DATA:   Fifteen week pregnant patient with epigastric abdominal pain, elevated LFTs and nausea/ vomiting.  EXAM: ULTRASOUND ABDOMEN COMPLETE  COMPARISON:  None.  FINDINGS: Gallbladder: Homogeneous echogenicity throughout gallbladder lumen suggestive of sludge. No gallbladder wall thickening. No pericholecystic fluid. Negative sonographic Murphy sign.  Common bile duct: Diameter: 2 mm  Liver: No focal lesion identified. Within normal limits in parenchymal echogenicity.  IVC: No abnormality visualized.  Pancreas: Visualized portion unremarkable.  Spleen: Size and appearance within normal limits.  Right Kidney: Length: 10.5 cm. Echogenicity within normal limits. No mass or hydronephrosis visualized.  Left Kidney: Length: 10.0 cm. Echogenicity within normal limits. No mass or hydronephrosis visualized.  Abdominal aorta: No aneurysm visualized.  Other findings: None.  IMPRESSION: The internal contents of the gallbladder are homogeneously isoechoic to the liver suggestive of sludge filled gallbladder. No definite evidence for gallbladder wall thickening, pericholecystic fluid or sonographic Murphy's sign. No ancillary findings to suggest acute cholecystitis.   Electronically Signed   By: Annia Belt M.D.   On: 12/10/2014 11:03      Impression / Plan:   Melanie Dominguez is a 24 y.o. y/o female who comes in with hyperemesis and is pregnant. The patient had abnormal liver enzymes that are now coming down. The bilirubin has gone from 6-4 and her LFTs are also coming down. The patient phosphatase head is not elevated and imaging of the liver did not show any cause for her abnormal liver enzymes. There was some sludge in the gallbladder with no sign of thickened gallbladder wall or evidence of cholecystitis. The patient will have her blood sent off for possible causes of abnormal liver enzymes including ANA, AMA SMA ceruloplasmin CMV EBV and ferritin. The patient has been explained the plan and agrees with it.  Thank you  for involving me in the care of this patient.        Darlina Rumpf, MD  12/10/2014, 5:22 PM   Note: This dictation was prepared with Dragon dictation along with smaller phrase technology. Any transcriptional errors that result from this process are unintentional.

## 2014-12-10 NOTE — Progress Notes (Signed)
Antepartum Note  24 year old G1 P0 at 19wk 3d by a 9 week ultrasound admitted yesterday with hyperemesis. She reports no further vomiting after receiving IV antiemetics. Voided x2 since admission. Has had epigastric pain x 4 days that started after her vomiting started. Rated the pain as a 7/10 yesterday and is now a 4/10. Denies any past hx of GB/ liver disease, or pancreatitis. Last BM 1 day ago, appeared normal color  O: BP 115/70 mmHg  Pulse 82  Temp(Src) 98.1 F (36.7 C) (Oral)  Resp 18  Ht 5' (1.524 m)  Wt 117 lb (53.071 kg)  BMI 22.85 kg/m2  SpO2 100%  LMP 08/31/2014   Results for orders placed or performed during the hospital encounter of 12/09/14 (from the past 24 hour(s))  Comprehensive metabolic panel     Status: Abnormal   Collection Time: 12/09/14  4:12 PM  Result Value Ref Range   Sodium 134 (L) 135 - 145 mmol/L   Potassium 2.9 (LL) 3.5 - 5.1 mmol/L   Chloride 90 (L) 101 - 111 mmol/L   CO2 25 22 - 32 mmol/L   Glucose, Bld 111 (H) 65 - 99 mg/dL   BUN 22 (H) 6 - 20 mg/dL   Creatinine, Ser 1.61 0.44 - 1.00 mg/dL   Calcium 09.6 (H) 8.9 - 10.3 mg/dL   Total Protein 8.9 (H) 6.5 - 8.1 g/dL   Albumin 5.0 3.5 - 5.0 g/dL   AST 77 (H) 15 - 41 U/L   ALT 126 (H) 14 - 54 U/L   Alkaline Phosphatase 54 38 - 126 U/L   Total Bilirubin 6.2 (H) 0.3 - 1.2 mg/dL   GFR calc non Af Amer >60 >60 mL/min   GFR calc Af Amer >60 >60 mL/min   Anion gap 19 (H) 5 - 15  CBC     Status: Abnormal   Collection Time: 12/09/14  4:12 PM  Result Value Ref Range   WBC 13.5 (H) 3.6 - 11.0 K/uL   RBC 5.23 (H) 3.80 - 5.20 MIL/uL   Hemoglobin 15.7 12.0 - 16.0 g/dL   HCT 04.5 40.9 - 81.1 %   MCV 87.5 80.0 - 100.0 fL   MCH 30.1 26.0 - 34.0 pg   MCHC 34.3 32.0 - 36.0 g/dL   RDW 91.4 78.2 - 95.6 %   Platelets 340 150 - 440 K/uL  Lipase, blood     Status: Abnormal   Collection Time: 12/09/14  4:12 PM  Result Value Ref Range   Lipase 74 (H) 22 - 51 U/L  Urinalysis complete, with microscopic ([facility]  only)     Status: Abnormal   Collection Time: 12/09/14  4:12 PM  Result Value Ref Range   Color, Urine AMBER (A) YELLOW   APPearance HAZY (A) CLEAR   Glucose, UA NEGATIVE NEGATIVE mg/dL   Bilirubin Urine 1+ (A) NEGATIVE   Ketones, ur 1+ (A) NEGATIVE mg/dL   Specific Gravity, Urine 1.012 1.005 - 1.030   Hgb urine dipstick 1+ (A) NEGATIVE   pH 5.0 5.0 - 8.0   Protein, ur 30 (A) NEGATIVE mg/dL   Nitrite NEGATIVE NEGATIVE   Leukocytes, UA TRACE (A) NEGATIVE   RBC / HPF 0-5 0 - 5 RBC/hpf   WBC, UA 6-30 0 - 5 WBC/hpf   Bacteria, UA MANY (A) NONE SEEN   Squamous Epithelial / LPF 0-5 (A) NONE SEEN   Mucous PRESENT    Hyaline Casts, UA PRESENT   Bilirubin, fractionated(tot/dir/indir)  Status: Abnormal   Collection Time: 12/09/14  4:23 PM  Result Value Ref Range   Total Bilirubin 5.8 (H) 0.3 - 1.2 mg/dL   Bilirubin, Direct 3.1 (H) 0.1 - 0.5 mg/dL   Indirect Bilirubin 2.7 (H) 0.3 - 0.9 mg/dL  Comprehensive metabolic panel     Status: Abnormal   Collection Time: 12/10/14  6:29 AM  Result Value Ref Range   Sodium 133 (L) 135 - 145 mmol/L   Potassium 2.4 (LL) 3.5 - 5.1 mmol/L   Chloride 99 (L) 101 - 111 mmol/L   CO2 27 22 - 32 mmol/L   Glucose, Bld 120 (H) 65 - 99 mg/dL   BUN 9 6 - 20 mg/dL   Creatinine, Ser 4.09 0.44 - 1.00 mg/dL   Calcium 8.9 8.9 - 81.1 mg/dL   Total Protein 6.5 6.5 - 8.1 g/dL   Albumin 3.6 3.5 - 5.0 g/dL   AST 60 (H) 15 - 41 U/L   ALT 98 (H) 14 - 54 U/L   Alkaline Phosphatase 42 38 - 126 U/L   Total Bilirubin 4.1 (H) 0.3 - 1.2 mg/dL   GFR calc non Af Amer >60 >60 mL/min   GFR calc Af Amer >60 >60 mL/min   Anion gap 7 5 - 15  TSH     Status: None   Collection Time: 12/10/14  6:29 AM  Result Value Ref Range   TSH 1.265 0.350 - 4.500 uIU/mL  T4, free     Status: None   Collection Time: 12/10/14  6:29 AM  Result Value Ref Range   Free T4 1.09 0.61 - 1.12 ng/dL   General: appears comfortable, in NAD Heart: RRR with no murmur, ? Split S2 Abdomen: tenderness  to palpation of epigastric area, negative Murphy's sign, hypoactive bowel sounds, ND Extremities: No tenderness or inflammation Urine output=800 ml with a little over 1800 ml IV intake  A: Liver enzymes and bilirubin decreasing, but still elevated, lipase mildly elevated last night Hypokalemia (K+=2.4) Epigastric pain-improved Pyuria  P: 30 meq KCL added to IV Gastroenterology consult Abdominal ultrasound Ice chips Pepcid IV Mefoxin IV for possible UTI Urine culture Cheynne Virden, CNM

## 2014-12-11 DIAGNOSIS — R748 Abnormal levels of other serum enzymes: Secondary | ICD-10-CM | POA: Diagnosis not present

## 2014-12-11 DIAGNOSIS — R17 Unspecified jaundice: Secondary | ICD-10-CM | POA: Diagnosis not present

## 2014-12-11 LAB — BASIC METABOLIC PANEL
ANION GAP: 4 — AB (ref 5–15)
BUN: <5 mg/dL — ABNORMAL LOW (ref 6–20)
CALCIUM: 8.5 mg/dL — AB (ref 8.9–10.3)
CO2: 26 mmol/L (ref 22–32)
CREATININE: 0.59 mg/dL (ref 0.44–1.00)
Chloride: 106 mmol/L (ref 101–111)
Glucose, Bld: 97 mg/dL (ref 65–99)
Potassium: 3.1 mmol/L — ABNORMAL LOW (ref 3.5–5.1)
SODIUM: 136 mmol/L (ref 135–145)

## 2014-12-11 LAB — HEPATIC FUNCTION PANEL
ALT: 111 U/L — ABNORMAL HIGH (ref 14–54)
AST: 74 U/L — AB (ref 15–41)
Albumin: 3.2 g/dL — ABNORMAL LOW (ref 3.5–5.0)
Alkaline Phosphatase: 37 U/L — ABNORMAL LOW (ref 38–126)
BILIRUBIN DIRECT: 1.9 mg/dL — AB (ref 0.1–0.5)
BILIRUBIN INDIRECT: 1.3 mg/dL — AB (ref 0.3–0.9)
BILIRUBIN TOTAL: 3.2 mg/dL — AB (ref 0.3–1.2)
Total Protein: 5.7 g/dL — ABNORMAL LOW (ref 6.5–8.1)

## 2014-12-11 LAB — FERRITIN: Ferritin: 207 ng/mL (ref 11–307)

## 2014-12-11 MED ORDER — POTASSIUM CHLORIDE CRYS ER 20 MEQ PO TBCR
40.0000 meq | EXTENDED_RELEASE_TABLET | Freq: Three times a day (TID) | ORAL | Status: DC
  Administered 2014-12-11 – 2014-12-12 (×2): 40 meq via ORAL
  Filled 2014-12-11 (×2): qty 2

## 2014-12-11 MED ORDER — RANITIDINE HCL 150 MG/10ML PO SYRP: 150.0000 mg | ORAL_SOLUTION | Freq: Two times a day (BID) | ORAL | Status: DC

## 2014-12-11 MED ORDER — ONDANSETRON HCL 4 MG PO TABS: 8.0000 mg | ORAL_TABLET | Freq: Three times a day (TID) | ORAL | Status: DC | PRN

## 2014-12-11 MED ORDER — ONDANSETRON HCL 4 MG/2ML IJ SOLN: 4.0000 mg | Freq: Three times a day (TID) | INTRAMUSCULAR | Status: DC | PRN

## 2014-12-11 MED ORDER — PROMETHAZINE HCL 25 MG PO TABS: 25.0000 mg | ORAL_TABLET | Freq: Four times a day (QID) | ORAL | Status: DC | PRN

## 2014-12-11 NOTE — Progress Notes (Signed)
Subjective: Patient without complaints. Doing well. LFT's still elevated with the bili coming down. The alk phos continues to be normal. Blood work to look for  A cause still pending. Objective: Vital signs in last 24 hours: Filed Vitals:   12/11/14 0009 12/11/14 0428 12/11/14 0732 12/11/14 1156  BP: 106/60 109/60 111/69 109/59  Pulse: 87 82 87 89  Temp: 98.3 F (36.8 C) 97.7 F (36.5 C) 98.3 F (36.8 C) 98.8 F (37.1 C)  TempSrc: Oral Oral Oral Oral  Resp: _0 Height:      Weight:      SpO2: 100%  100%    Weight change: 4 lb 9.6 oz (2.087 kg)  Intake/Output Summary (Last 24 hours) at 12/11/14 1240 Last data filed at 12/11/14 0900  Gross per 24 hour  Intake 3117.34 ml  Output    900 ml  Net 2217.34 ml   BP 109/59 mmHg  Pulse 89  Temp(Src) 98.8 F (37.1 C) (Oral)  Resp 18  Ht 4' 11.5" (1.511 m)  Wt 120 lb 9.6 oz (54.704 kg)  BMI 23.96 kg/m2  SpO2 100%  LMP 08/31/2014  General Appearance:    Alert, cooperative, no distress, appears stated age  Head:    Normocephalic, without obvious abnormality, atraumatic  Eyes:    PERRL, conjunctiva/corneas clear, EOM's intact, fundi    benign, both eyes        Throat:   Lips, mucosa, and tongue normal; teeth and gums normal  Neck:   Supple, symmetrical, trachea midline, no adenopathy;    thyroid:  no enlargement/tenderness/nodules; no carotid   bruit or JVD     Lungs:     Clear to auscultation bilaterally, respirations unlabored  Chest Wall:    No tenderness or deformity   Heart:    Regular rate and rhythm, S1 and S2 normal, no murmur, rub   or gallop     Abdomen:     Soft, non-tender, bowel sounds active all four quadrants,    no masses, no organomegaly, gravid        Extremities:   Extremities normal, atraumatic, no cyanosis or edema  Pulses:   2+ and symmetric all extremities  Skin:   Skin color, texture, turgor normal, no rashes or lesions     Neurologic:   CNII-XII intact, normal strength, sensation and  reflexes    throughout   Lab Results: _1 @ Micro Results: Recent Results (from the past 240 hour(s))  Urine culture     Status: None (Preliminary result)   Collection Time: 12/09/14  4:12 PM  Result Value Ref Range Status   Specimen Description URINE, RANDOM  Final   Special Requests NONE  Final   Culture NO GROWTH < 24 HOURS  Final   Report Status PENDING  Incomplete   Studies/Results: US Abdomen Complete  12/10/2014   CLINICAL DATA:  Fifteen week pregnant patient with epigastric abdominal pain, elevated LFTs and nausea/ vomiting.  EXAM: ULTRASOUND ABDOMEN COMPLETE  COMPARISON:  None.  FINDINGS: Gallbladder: Homogeneous echogenicity throughout gallbladder lumen suggestive of sludge. No gallbladder wall thickening. No pericholecystic fluid. Negative sonographic Murphy sign.  Common bile duct: Diameter: 2 mm  Liver: No focal lesion identified. Within normal limits in parenchymal echogenicity.  IVC: No abnormality visualized.  Pancreas: Visualized portion unremarkable.  Spleen: Size and appearance within normal limits.  Right Kidney: Length: 10.5 cm. Echogenicity within normal limits. No mass or hydronephrosis visualized.  Left Kidney: Length: 10.0 cm. Echogenicity within normal limits. No  mass or hydronephrosis visualized.  Abdominal aorta: No aneurysm visualized.  Other findings: None.  IMPRESSION: The internal contents of the gallbladder are homogeneously isoechoic to the liver suggestive of sludge filled gallbladder. No definite evidence for gallbladder wall thickening, pericholecystic fluid or sonographic Murphy's sign. No ancillary findings to suggest acute cholecystitis.   Electronically Signed   By: Lovey Newcomer M.D.   On: 12/10/2014 11:03   Medications: I have reviewed the patient's current medications. Scheduled Meds: . famotidine (PEPCID) IV  20 mg Intravenous Q12H   Continuous Infusions: . dexrose 5 % and 0.45 % NaCl with KCl 30 mEq/L 75 mL/hr at 12/11/14 0920   PRN  Meds:.ondansetron (ZOFRAN) IV, ondansetron, promethazine Assessment/Plan: Active Problems:   Hyperemesis gravidarum with metabolic disturbance   Elevated bilirubin  Plan: Patient with increased LFT's. Multiple labs pending. Dr. Gustavo Lah will be following over the weekend. Bili coming down. Will follow with you.   Melanie Dominguez 12/11/2014, 12:40 PM

## 2014-12-11 NOTE — Progress Notes (Signed)
Daily Antepartum Note  Admission Date: 12/09/2014 Current Date: 12/11/2014  Melanie Dominguez is a 24 y.o. G1 @ 15/4 by 9wk u/s, HD#3, admitted for nausea, vomiting; pt diagnosed with abnormal liver enzymes on admission, hypokalemia  Pregnancy complicated by: nothing   Overnight/24hr events:  Pt states she had no nausea, vomiting and was able to eat and drink fine  Subjective:  As above. No fevers, chills, preterm labor s/s  Objective:    Current Vital Signs 24h Vital Sign Ranges  T 98.3 F (36.8 C) Temp  Avg: 98.3 F (36.8 C)  Min: 97.7 F (36.5 C)  Max: 98.6 F (37 C)  BP 111/69 mmHg BP  Min: 106/60  Max: 136/90  HR 87 Pulse  Avg: 85.6  Min: 75  Max: 96  RR 18 Resp  Avg: 18  Min: 16  Max: 20  SaO2 100 % Not Delivered SpO2  Avg: 99.7 %  Min: 99 %  Max: 100 %       24 Hour I/O Current Shift I/O  Time Ins Outs 09/22 0701 - 09/23 0700 In: 4814.8 [P.O.:240; I.V.:4274.8] Out: 2150 [Urine:2150]     Patient Vitals for the past 24 hrs:  BP Temp Temp src Pulse Resp SpO2 Height Weight  12/11/14 0732 111/69 mmHg 98.3 F (36.8 C) Oral 87 18 100 % - -  12/11/14 0428 109/60 mmHg 97.7 F (36.5 C) Oral 82 18 - - -  12/11/14 0009 106/60 mmHg 98.3 F (36.8 C) Oral 87 20 100 % - -  12/10/14 2045 - - - - - - 4' 11.5" (1.511 m) 120 lb 9.6 oz (54.704 kg)  12/10/14 1932 110/61 mmHg 98.6 F (37 C) Oral 75 20 - - -  12/10/14 1655 124/84 mmHg 98.4 F (36.9 C) Oral 88 16 - - -  12/10/14 1230 136/90 mmHg 98.6 F (37 C) Oral 96 - - - -  12/10/14 1100 (!) 110/59 mmHg 98.2 F (36.8 C) Oral 84 16 99 % - -    Physical exam: General: Well nourished, well developed female in no acute distress. Abdomen: gravid nttp Cardiovascular: RRR, no MRGs Respiratory: CTAB Extremities: no clubbing, cyanosis or edema Skin: Warm and dry.   Medications: Current Facility-Administered Medications  Medication Dose Route Frequency Sedonia Kitner Last Rate Last Dose  . cefOXitin (MEFOXIN) 2 g in dextrose 5 % 50  mL IVPB  2 g Intravenous 4 times per day Farrel Conners, CNM   2 g at 12/11/14 0518  . dextrose 5 % and 0.45 % NaCl with KCl 30 mEq/L infusion   Intravenous Continuous Farrel Conners, CNM 125 mL/hr at 12/11/14 0520    . famotidine (PEPCID) IVPB 20 mg premix  20 mg Intravenous Q12H Farrel Conners, CNM   20 mg at 12/10/14 2153  . metoCLOPramide (REGLAN) injection 10 mg  10 mg Intravenous Q6H PRN Vena Austria, MD      . ondansetron [facility]) injection 4 mg  4 mg Intravenous Q6H PRN Vena Austria, MD        Recent Labs Lab 12/09/14 1612  WBC 13.5*  HGB 15.7  HCT 45.8  PLT 340    Recent Labs Lab 12/09/14 1612 12/09/14 1623 12/10/14 0629 12/10/14 1841  NA 134*  --  133*  --   K 2.9*  --  2.4*  --   CL 90*  --  99*  --   CO2 25  --  27  --   BUN 22*  --  9  --  CREATININE 0.92  --  0.47  --   CALCIUM 10.5*  --  8.9  --   PROT 8.9*  --  6.5 6.4*  BILITOT 6.2* 5.8* 4.1* 3.5*  ALKPHOS 54  --  42 39  ALT 126*  --  98* 103*  AST 77*  --  60* 70*  GLUCOSE 111*  --  120*  --     Recent Labs Lab 12/10/14 0629  TSH 1.265  FREET4 1.09   Pending: GI labs, UCx   Recent Labs Lab 12/09/14 1623 12/10/14 0629 12/10/14 1841  BILITOT 5.8* 4.1* 3.5*  BILIDIR 3.1*  --  1.9*   9/21: lipase 74  Radiology:  EXAM: ULTRASOUND ABDOMEN COMPLETE  COMPARISON: None.  FINDINGS: Gallbladder: Homogeneous echogenicity throughout gallbladder lumen suggestive of sludge. No gallbladder wall thickening. No pericholecystic fluid. Negative sonographic Murphy sign.  Common bile duct: Diameter: 2 mm  Liver: No focal lesion identified. Within normal limits in parenchymal echogenicity.  IVC: No abnormality visualized.  Pancreas: Visualized portion unremarkable.  Spleen: Size and appearance within normal limits.  Right Kidney: Length: 10.5 cm. Echogenicity within normal limits. No mass or hydronephrosis visualized.  Left Kidney: Length: 10.0 cm. Echogenicity  within normal limits. No mass or hydronephrosis visualized.  Abdominal aorta: No aneurysm visualized.  Other findings: None.  IMPRESSION: The internal contents of the gallbladder are homogeneously isoechoic to the liver suggestive of sludge filled gallbladder. No definite evidence for gallbladder wall thickening, pericholecystic fluid or sonographic Murphy's sign. No ancillary findings to suggest acute cholecystitis.   Electronically Signed  By: Annia Belt M.D.  On: 12/10/2014 11:03           Vitals     Height Weight BMI (Calculated)    4' 11.5" (1.511 m) 120 lb 9.6 oz (54.704 kg) 24      Interpretation Summary     CLINICAL DATA: Fifteen week pregnant patient with epigastric abdominal pain, elevated LFTs and nausea/ vomiting.  EXAM: ULTRASOUND ABDOMEN COMPLETE  COMPARISON: None.  FINDINGS: Gallbladder: Homogeneous echogenicity throughout gallbladder lumen suggestive of sludge. No gallbladder wall thickening. No pericholecystic fluid. Negative sonographic Murphy sign.  Common bile duct: Diameter: 2 mm  Liver: No focal lesion identified. Within normal limits in parenchymal echogenicity.  IVC: No abnormality visualized.  Pancreas: Visualized portion unremarkable.  Spleen: Size and appearance within normal limits.  Right Kidney: Length: 10.5 cm. Echogenicity within normal limits. No mass or hydronephrosis visualized.  Left Kidney: Length: 10.0 cm. Echogenicity within normal limits. No mass or hydronephrosis visualized.  Abdominal aorta: No aneurysm visualized.  Other findings: None.  IMPRESSION: The internal contents of the gallbladder are homogeneously isoechoic to the liver suggestive of sludge filled gallbladder. No definite evidence for gallbladder wall thickening, pericholecystic fluid or sonographic Murphy's sign. No ancillary findings to suggest acute cholecystitis.   Electronically Signed  By: Annia Belt M.D.  On: 12/10/2014 11:03    Assessment & Plan:  Pt with improved s/s *IUP: no s/s of distress; qday FHTs *GI/FEN: 120lbs 9/22, 116-117lbs on 9/21. Only needed zofran once yesterday. Will change meds to PO -follow up GI labs -GI following and appreciate recs -BMP and Mg ordered for today; if still hypokalemic will get prn ECG and telemetry and supplement with PO (pt on IV 30 of K with MIVF).  *ID: no s/s of UTI; can d/c abx cefoxitin D#2 *Dispo: pending GI assessment and see how she tolerate oral meds  Cornelia Copa. MD [facility] [phone]

## 2014-12-11 NOTE — Progress Notes (Signed)
Educated and talked with the patient about her hospital visit. Melanie Dominguez the interpreter was present, RN explained to the patient that it takes several days for certain test to be read and processed. Patient understood explanation and did not have any other questions or concerns.

## 2014-12-12 LAB — COMPREHENSIVE METABOLIC PANEL
ALBUMIN: 3 g/dL — AB (ref 3.5–5.0)
ALT: 121 U/L — ABNORMAL HIGH (ref 14–54)
ANION GAP: 3 — AB (ref 5–15)
AST: 70 U/L — AB (ref 15–41)
Alkaline Phosphatase: 43 U/L (ref 38–126)
BILIRUBIN TOTAL: 1.9 mg/dL — AB (ref 0.3–1.2)
BUN: <5 mg/dL — ABNORMAL LOW (ref 6–20)
CHLORIDE: 110 mmol/L (ref 101–111)
CO2: 25 mmol/L (ref 22–32)
Calcium: 9 mg/dL (ref 8.9–10.3)
Creatinine, Ser: 0.35 mg/dL — ABNORMAL LOW (ref 0.44–1.00)
GFR calc Af Amer: >60 mL/min (ref >60)
GFR calc non Af Amer: >60 mL/min (ref >60)
GLUCOSE: 96 mg/dL (ref 65–99)
POTASSIUM: 3.7 mmol/L (ref 3.5–5.1)
SODIUM: 138 mmol/L (ref 135–145)
TOTAL PROTEIN: 5.5 g/dL — AB (ref 6.5–8.1)

## 2014-12-12 LAB — URINE CULTURE: CULTURE: NO GROWTH

## 2014-12-12 MED ORDER — FAMOTIDINE 20 MG PO TABS
20.0000 mg | ORAL_TABLET | Freq: Two times a day (BID) | ORAL | Status: DC
  Administered 2014-12-12 – 2014-12-13 (×4): 20 mg via ORAL
  Filled 2014-12-12 (×4): qty 1

## 2014-12-12 NOTE — Progress Notes (Addendum)
Reviewed patient's hospital and clinical course with Dr. Marva Panda. Based on his recommendation, will watch patient overnight and repeat liver function test labs in the AM. If stable and improving, will have her follow up as an outpatient with GI to have them follow her labs.   Several labs are pending that will likely take a few more days to return.  Will not await those if she is stable.

## 2014-12-12 NOTE — Consult Note (Signed)
Subjective: Patient seen for abnormal liver enzymes. Patient appears very comfortable. She has had no nausea or vomiting for over 24 hours. She tolerated a regular breakfast. He denies any abdominal pain.  Objective: Vital signs in last 24 hours: Temp:  [98 F (36.7 C)-99.1 F (37.3 C)] 98.3 F (36.8 C) (09/24 1202) Pulse Rate:  [75-99] 77 (09/24 1202) Resp:  [16-18] 18 (09/24 1202) BP: (102-112)/(57-63) 112/63 mmHg (09/24 1202) SpO2:  [99 %-100 %] 100 % (09/24 0740) Weight:  [56.246 kg (124 lb)] 56.246 kg (124 lb) (09/24 0445) Blood pressure 112/63, pulse 77, temperature 98.3 F (36.8 C), temperature source Oral, resp. rate 18, height 4' 11.5" (1.511 m), weight 56.246 kg (124 lb), last menstrual period 08/31/2014, SpO2 100 %.   Intake/Output from previous day: 09/23 0701 - 09/24 0700 In: 1680 [P.O.:240; I.V.:1440] Out: 1900 [Urine:1900]  Intake/Output this shift: Total I/O In: -  Out: 600 [Urine:600]   General appearance:  Well-appearing 24 year old female no acute distress Resp:  Clear to auscultation Cardio:  Regular rate and rhythm GI:  Soft nontender nondistended bowel sounds positive normoactive Extremities:     Lab Results: Results for orders placed or performed during the hospital encounter of 12/09/14 (from the past 24 hour(s))  Comprehensive metabolic panel     Status: Abnormal   Collection Time: 12/12/14  4:33 AM  Result Value Ref Range   Sodium 138 135 - 145 mmol/L   Potassium 3.7 3.5 - 5.1 mmol/L   Chloride 110 101 - 111 mmol/L   CO2 25 22 - 32 mmol/L   Glucose, Bld 96 65 - 99 mg/dL   BUN <5 (L) 6 - 20 mg/dL   Creatinine, Ser 4.54 (L) 0.44 - 1.00 mg/dL   Calcium 9.0 8.9 - 09.8 mg/dL   Total Protein 5.5 (L) 6.5 - 8.1 g/dL   Albumin 3.0 (L) 3.5 - 5.0 g/dL   AST 70 (H) 15 - 41 U/L   ALT 121 (H) 14 - 54 U/L   Alkaline Phosphatase 43 38 - 126 U/L   Total Bilirubin 1.9 (H) 0.3 - 1.2 mg/dL   GFR calc non Af Amer >60 >60 mL/min   GFR calc Af Amer >60 >60  mL/min   Anion gap 3 (L) 5 - 15      Recent Labs  12/09/14 1612  WBC 13.5*  HGB 15.7  HCT 45.8  PLT 340   BMET  Recent Labs  12/10/14 0629 12/11/14 0631 12/12/14 0433  NA 133* 136 138  K 2.4* 3.1* 3.7  CL 99* 106 110  CO2 GLUCOSE 120* 97 96  BUN 9 <5* <5*  CREATININE 0.47 0.59 0.35*  CALCIUM 8.9 8.5* 9.0   LFT  Recent Labs  12/11/14 0631 12/12/14 0433  PROT 5.7* 5.5*  ALBUMIN 3.2* 3.0*  AST 74* 70*  ALT 111* 121*  ALKPHOS 37* 43  BILITOT 3.2* 1.9*  BILIDIR 1.9*  --   IBILI 1.3*  --    PT/INR No results for input(s): LABPROT, INR in the last 72 hours. Hepatitis Panel No results for input(s): HEPBSAG, HCVAB, HEPAIGM, HEPBIGM in the last 72 hours. C-Diff No results for input(s): CDIFFTOX in the last 72 hours. No results for input(s): CDIFFPCR in the last 72 hours.   Studies/Results: No results found.  Scheduled Inpatient Medications:   . famotidine  20 mg Oral BID  . potassium chloride  40 mEq Oral TID WC    Continuous Inpatient Infusions:   . dexrose  5 % and 0.45 % NaCl with KCl 30 mEq/L 75 mL/hr at 12/11/14 1600    PRN Inpatient Medications:  ondansetron (ZOFRAN) IV, ondansetron, promethazine  Miscellaneous:   Assessment:  1. Abnormal liver associated enzymes. Mixed picture with elevated transaminases and bilirubin. Bilirubin is improving. Of note dominant ultrasound did show sludge in the gallbladder but a normal common bile duct dimension. Multiple laboratory still pending in regards to liver evaluation. 2. Gravida 15 weeks with history of hyperemesis gravida Plan:  1. Continue current. No new recommendations. Following with you  Christena Deem MD 12/12/2014, 3:17 PM

## 2014-12-12 NOTE — Progress Notes (Signed)
Patient ID: Melanie Dominguez, female   DOB: 09-12-1990, 24 y.o.   MRN: [id number] Daily Antepartum Note  Admission Date: 12/09/2014 Current Date: 12/12/2014  Leretha Pol Maretta Los is a 24 y.o. G1 @ 15/5 by 9wk u/s, HD#4, admitted for nausea, vomiting; pt diagnosed with abnormal liver enzymes on admission, hypokalemia  Pregnancy complicated by: nothing   Overnight/24hr events:  Pt states she had no nausea, vomiting and was able to eat and drink fine  Subjective:  As above. No fevers, chills, preterm labor s/s  Objective:    Current Vital Signs 24h Vital Sign Ranges  T 99 F (37.2 C) Temp  Avg: 98.6 F (37 C)  Min: 98 F (36.7 C)  Max: 99.1 F (37.3 C)  BP 102/60 mmHg BP  Min: 102/60  Max: 109/59  HR 75 Pulse  Avg: 84.7  Min: 75  Max: 99  RR 16 Resp  Avg: 17.3  Min: 16  Max: 18  SaO2 100 % Not Delivered SpO2  Avg: 99.8 %  Min: 99 %  Max: 100 %       24 Hour I/O Current Shift I/O  Time Ins Outs 09/23 0701 - 09/24 0700 In: 1680 [P.O.:240; I.V.:1440] Out: 1900 [Urine:1900] 09/24 0701 - 09/24 1900 In: -  Out: 600 [Urine:600]   Patient Vitals for the past 24 hrs:  BP Temp Temp src Pulse Resp SpO2 Weight  12/12/14 0740 102/60 mmHg 99 F (37.2 C) Oral 75 16 100 % -  12/12/14 0448 107/62 mmHg 98.2 F (36.8 C) Oral 75 16 100 % -  12/12/14 0445 - - - - - - 124 lb (56.246 kg)  12/12/14 0027 108/60 mmHg 98 F (36.7 C) Oral 88 18 99 % -  12/11/14 2041 108/63 mmHg 98.3 F (36.8 C) Oral 99 18 100 % -  12/11/14 1616 (!) 106/57 mmHg 99.1 F (37.3 C) Oral 82 18 - -  12/11/14 1156 (!) 109/59 mmHg 98.8 F (37.1 C) Oral 89 18 - -    Physical exam: General: Well nourished, well developed female in no acute distress. Abdomen: gravid nttp Cardiovascular: RRR, no MRGs Respiratory: CTAB Extremities: no clubbing, cyanosis or edema Skin: Warm and dry.   Medications: Current Facility-Administered Medications  Medication Dose Route Frequency Provider Last Rate Last Dose  . dextrose 5  % and 0.45 % NaCl with KCl 30 mEq/L infusion   Intravenous Continuous [place] Bing, MD 75 mL/hr at 12/11/14 1600    . famotidine (PEPCID) tablet 20 mg  20 mg Oral BID [place] Bing, MD   20 mg at 12/12/14 1131  . ondansetron (ZOFRAN) injection 4 mg  4 mg Intravenous Q8H PRN [place] Bing, MD      . ondansetron (ZOFRAN) tablet 8 mg  8 mg Oral Q8H PRN [place] Bing, MD      . potassium chloride SA (K-DUR,KLOR-CON) CR tablet 40 mEq  40 mEq Oral TID WC [place] Bing, MD   40 mEq at 12/12/14 1131  . promethazine (PHENERGAN) tablet 25 mg  25 mg Oral Q6H PRN [place] Bing, MD        Recent Labs Lab 12/09/14 1612  WBC 13.5*  HGB 15.7  HCT 45.8  PLT 340    Recent Labs Lab 12/10/14 0629 12/10/14 1841 12/11/14 0631 12/12/14 0433  NA 133*  --  136 138  K 2.4*  --  3.1* 3.7  CL 99*  --  106 110  CO2 27  --  26 25  BUN 9  --  <  5* <5*  CREATININE 0.47  --  0.59 0.35*  CALCIUM 8.9  --  8.5* 9.0  PROT 6.5 6.4* 5.7* 5.5*  BILITOT 4.1* 3.5* 3.2* 1.9*  ALKPHOS 42 39 37* 43  ALT 98* 103* 111* 121*  AST 60* 70* 74* 70*  GLUCOSE 120*  --  97 96    Recent Labs Lab 12/10/14 0629  TSH 1.265  FREET4 1.09   Urine culture negative so far  Pending: GI labs   Recent Labs Lab 12/09/14 1623  12/10/14 1841 12/11/14 0631 12/12/14 0433  BILITOT 5.8*  < > 3.5* 3.2* 1.9*  BILIDIR 3.1*  --  1.9* 1.9*  --   < > = values in this interval not displayed. 9/21: lipase 74  Radiology:  EXAM: ULTRASOUND ABDOMEN COMPLETE  COMPARISON: None.  FINDINGS: Gallbladder: Homogeneous echogenicity throughout gallbladder lumen suggestive of sludge. No gallbladder wall thickening. No pericholecystic fluid. Negative sonographic Murphy sign.  Common bile duct: Diameter: 2 mm  Liver: No focal lesion identified. Within normal limits in parenchymal echogenicity.  IVC: No abnormality visualized.  Pancreas: Visualized portion unremarkable.  Spleen: Size and appearance within normal  limits.  Right Kidney: Length: 10.5 cm. Echogenicity within normal limits. No mass or hydronephrosis visualized.  Left Kidney: Length: 10.0 cm. Echogenicity within normal limits. No mass or hydronephrosis visualized.  Abdominal aorta: No aneurysm visualized.  Other findings: None.  IMPRESSION: The internal contents of the gallbladder are homogeneously isoechoic to the liver suggestive of sludge filled gallbladder. No definite evidence for gallbladder wall thickening, pericholecystic fluid or sonographic Murphy's sign. No ancillary findings to suggest acute cholecystitis.   Electronically Signed  By: Annia Belt M.D.  On: 12/10/2014 11:03           Vitals     Height Weight BMI (Calculated)    4' 11.5" (1.511 m) 120 lb 9.6 oz (54.704 kg) 24      Interpretation Summary     CLINICAL DATA: Fifteen week pregnant patient with epigastric abdominal pain, elevated LFTs and nausea/ vomiting.  EXAM: ULTRASOUND ABDOMEN COMPLETE  COMPARISON: None.  FINDINGS: Gallbladder: Homogeneous echogenicity throughout gallbladder lumen suggestive of sludge. No gallbladder wall thickening. No pericholecystic fluid. Negative sonographic Murphy sign.  Common bile duct: Diameter: 2 mm  Liver: No focal lesion identified. Within normal limits in parenchymal echogenicity.  IVC: No abnormality visualized.  Pancreas: Visualized portion unremarkable.  Spleen: Size and appearance within normal limits.  Right Kidney: Length: 10.5 cm. Echogenicity within normal limits. No mass or hydronephrosis visualized.  Left Kidney: Length: 10.0 cm. Echogenicity within normal limits. No mass or hydronephrosis visualized.  Abdominal aorta: No aneurysm visualized.  Other findings: None.  IMPRESSION: The internal contents of the gallbladder are homogeneously isoechoic to the liver suggestive of sludge filled gallbladder. No definite evidence for gallbladder wall  thickening, pericholecystic fluid or sonographic Murphy's sign. No ancillary findings to suggest acute cholecystitis.   Electronically Signed  By: Annia Belt M.D.  On: 12/10/2014 11:03    Assessment & Plan:  Pt with improved s/s *IUP: no s/s of distress; qday FHTs *GI/FEN: 120lbs 9/22, 116-117lbs on 9/21. Doing well on PO zofran and reglan -follow up GI labs -GI following and appreciate recs.   * LFTs stable to slightly increase today, apart from improving total bilirubin which is improving. -BMP and Mg ordered for today;   * hypokalemia: resolved with repletion.  Ongoing per GI. However, may have been acute due to loss with emesis.  *ID: no s/s of UTI; can  d/c abx cefoxitin D#2. Urine culture negative. *Dispo: pending GI assessment.   Conard Novak, MD, FACOG 12/12/2014 11:40 AM

## 2014-12-13 LAB — COMPREHENSIVE METABOLIC PANEL
ALBUMIN: 3.2 g/dL — AB (ref 3.5–5.0)
ALK PHOS: 43 U/L (ref 38–126)
ALT: 108 U/L — AB (ref 14–54)
ANION GAP: 5 (ref 5–15)
AST: 49 U/L — ABNORMAL HIGH (ref 15–41)
BILIRUBIN TOTAL: 1.6 mg/dL — AB (ref 0.3–1.2)
BUN: 6 mg/dL (ref 6–20)
CALCIUM: 9.2 mg/dL (ref 8.9–10.3)
CO2: 24 mmol/L (ref 22–32)
CREATININE: 0.48 mg/dL (ref 0.44–1.00)
Chloride: 108 mmol/L (ref 101–111)
GFR calc Af Amer: >60 mL/min (ref >60)
GFR calc non Af Amer: >60 mL/min (ref >60)
GLUCOSE: 85 mg/dL (ref 65–99)
Potassium: 4.3 mmol/L (ref 3.5–5.1)
Sodium: 137 mmol/L (ref 135–145)
TOTAL PROTEIN: 5.9 g/dL — AB (ref 6.5–8.1)

## 2014-12-13 LAB — PROTIME-INR
INR: 1.13
Prothrombin Time: 14.7 seconds (ref 11.4–15.0)

## 2014-12-13 MED ORDER — FAMOTIDINE 20 MG PO TABS: 20.0000 mg | ORAL_TABLET | Freq: Two times a day (BID) | ORAL | Status: DC

## 2014-12-13 MED ORDER — PROMETHAZINE HCL 25 MG PO TABS: 25.0000 mg | ORAL_TABLET | Freq: Four times a day (QID) | ORAL | Status: DC | PRN

## 2014-12-13 MED ORDER — ONDANSETRON HCL 8 MG PO TABS: 8.0000 mg | ORAL_TABLET | Freq: Three times a day (TID) | ORAL | Status: DC | PRN

## 2014-12-13 NOTE — Progress Notes (Signed)
Patient understands all discharge instructions and the need to make follow up appointments. Patient discharge via wheelchair with auxillary. 

## 2014-12-13 NOTE — Discharge Summary (Signed)
DC Summary Discharge Summary   Patient ID: Melanie Dominguez [id number] 24 y.o. 1990/09/25  Admit date: 12/09/2014  Discharge date: 12/13/2014  Principal Diagnoses:  1) intrauterine pregnancy at [redacted]w[redacted]d gestational age 71) hyperemesis gravidarum 3) hypokalemia 4) abnormal liver function tests  Secondary Diagnoses: None   Procedures performed during the hospitalization:  1) IV fluid resuscitation and electrolyte repletion 2) treatment of hyperemesis gravidarum 3) right upper quadrant ultrasound  4) GI consultation  HPI: Patient is a 24 y.o. G1P0 at [redacted]w[redacted]d today 9 week [place] derived EDC of 05/31/2015 with hyperemesis. 16.4lbs weight loss since 11/20/2014, UA at [facility] with 4+ ketones, 3+ proteinuria, 3+ bilirubin with signs of dehydration and jaundice at her routine prenatal visit today. The patient has had problems with nausea throughout the pregnancy but acute worsening over the past 8 days. Reports 16 episodes of emesis yesterday, 8 episodes today. Some epigastric pain, no RUQ or back pain. Denies loose stools. No fevers, no sick contacts. Interestingly the patient does reports episodes of jaundice if she goes long periods fasting. No vaginal bleeding, fetal movement, LOF.   Past Medical History: History reviewed. No pertinent past medical history.  Past Surgical History: History reviewed. No pertinent past surgical history.  Allergies:  No Known Allergies  Social History  Substance Use Topics  . Smoking status: Never Smoker   . Smokeless tobacco: Never Used  . Alcohol Use: No   Family History:   History reviewed. No pertinent family history.  Hospital Course:  Patient admitted for the above.  Initial lab testing showed elevated LFTs.  A right upper quadrant ultrasound was obtained showing biliary sludge, but no other obvious issue with the liver or biliary system. Gastroenterology was consulted and multiple labs were drawn, many not returned by the time of  discharge.  Her LFTs remained essentially stable during her hospitalization (transaminase).  Her total bilirubin was elevated and continued to drop during her hospitalization.  Her INR was 1.1.  She improved quickly with IV fluid resuscitation and potassium replacement.  At the time of discharge she was tolerating a PO diet with no emesis.  She had daily fetal heart tones.  She had no other pregnancy issues during her stay. Her thyroid labs were normal.  Discharge Exam: BP 110/64 mmHg  Pulse 86  Temp(Src) 97.8 F (36.6 C) (Oral)  Resp 18  Ht 4' 11.5" (1.511 m)  Wt 123 lb 9.6 oz (56.065 kg)  BMI 24.56 kg/m2  SpO2 100%  LMP 08/31/2014 General  no apparent distress   CV  RRR   Pulmonary  clear to ausculatation bllaterally   Abdomen  Soft, nontender, non-distended, +BS, no rebound or guarding   Extremities  no edema, symmetric, SCDs in place    Condition at Discharge: Stable  Complications affecting treatment: None  Discharge Medications:    Medication List    TAKE these medications        famotidine 20 MG tablet  Commonly known as:  PEPCID  Take 1 tablet (20 mg total) by mouth 2 (two) times daily.     ondansetron 8 MG tablet  Commonly known as:  ZOFRAN  Take 1 tablet (8 mg total) by mouth every 8 (eight) hours as needed for nausea or vomiting (1st line).     promethazine 25 MG tablet  Commonly known as:  PHENERGAN  Take 1 tablet (25 mg total) by mouth every 6 (six) hours as needed for nausea or vomiting.       Follow-up arrangements:  Follow-up Information    Follow up with Daren Wohl, MD In 1 day.   Specialty:  [facility]   Why:  call for GI clinic appointment for lab follow up and further testing   Contact information:   [address] [place] [address] [phone]       Follow up with [facility] In 1 day.   Why:  follow up from hospitalization for hyperemesis gravidarum   Contact information:   [address] [place] [address] [phone]      Discharge Disposition: Home  Signed: Conard Novak, MD 12/13/2014 5:28 PM

## 2014-12-14 LAB — MITOCHONDRIAL ANTIBODIES: MITOCHONDRIAL M2 AB, IGG: 7.3 U (ref 0.0–20.0)

## 2014-12-14 LAB — CERULOPLASMIN: Ceruloplasmin: 10.7 mg/dL — ABNORMAL LOW (ref 19.0–39.0)

## 2014-12-14 LAB — RSV(RESPIRATORY SYNCYTIAL VIRUS) AB, BLOOD

## 2014-12-14 LAB — HEPATITIS PANEL, ACUTE
HCV Ab: <0.1 s/co ratio (ref 0.0–0.9)
HEP A IGM: NEGATIVE
HEP B C IGM: NEGATIVE
Hepatitis B Surface Ag: NEGATIVE

## 2014-12-14 LAB — ALPHA-1 ANTITRYPSIN PHENOTYPE: A1 ANTITRYPSIN SER: 173 mg/dL (ref 90–200)

## 2014-12-14 LAB — ANTINUCLEAR ANTIBODIES, IFA: ANA Ab, IFA: NEGATIVE

## 2014-12-14 LAB — CMV IGM: CMV IgM: <30 AU/mL (ref 0.0–29.9)

## 2014-12-14 LAB — EPSTEIN-BARR VIRUS VCA, IGG: EBV VCA IGG: 236 U/mL — AB (ref 0.0–17.9)

## 2014-12-14 LAB — EPSTEIN-BARR VIRUS VCA, IGM

## 2014-12-14 LAB — ANTI-SMOOTH MUSCLE ANTIBODY, IGG: F-Actin IgG: 15 Units (ref 0–19)

## 2014-12-30 ENCOUNTER — Other Ambulatory Visit: Payer: Self-pay | Admitting: Advanced Practice Midwife

## 2014-12-30 DIAGNOSIS — Z3402 Encounter for supervision of normal first pregnancy, second trimester: Secondary | ICD-10-CM

## 2015-01-04 ENCOUNTER — Telehealth: Payer: Self-pay

## 2015-01-04 ENCOUNTER — Other Ambulatory Visit: Payer: Self-pay

## 2015-01-04 DIAGNOSIS — R748 Abnormal levels of other serum enzymes: Secondary | ICD-10-CM

## 2015-01-04 NOTE — Telephone Encounter (Signed)
Called patient's cell phone and her husband answered. I asked to speak with his wife and he stated that he was working and asked me to call his wife at their home phone. I then called their home phone and the voicemail was not set-up yet. I will try to call her later on.  Patient needs to go to the lab ([facility]) and have her blood drawn.

## 2015-01-06 ENCOUNTER — Telehealth: Payer: Self-pay

## 2015-01-06 NOTE — Telephone Encounter (Signed)
See message by Anne NgMartiza. Called pt and left vm to call back about results. Order for repeat hepatic function has been put in.

## 2015-01-06 NOTE — Telephone Encounter (Signed)
Called patient's husband to let him know that I have not been able to speak with his wife since she never replied to my call. I told him that I would be sending her a letter to let her know that she needs to go to the lab for blood work with the order. He understood and agreed since he doesn't speak to his wife daily and [place].

## 2015-01-06 NOTE — Telephone Encounter (Signed)
-----   Message from Midge Miniumarren Wohl, MD sent at 12/29/2014  6:09 AM EDT ----- Patient needs to have liver enzymes checked to see if they are back to normal.

## 2015-01-12 ENCOUNTER — Ambulatory Visit
Admission: RE | Admit: 2015-01-12 | Discharge: 2015-01-12 | Disposition: A | Discharge status: Home / Self Care | Payer: Medicaid Other | Source: Ambulatory Visit | Attending: Advanced Practice Midwife | Admitting: Advanced Practice Midwife

## 2015-01-12 DIAGNOSIS — Z3A20 20 weeks gestation of pregnancy: Secondary | ICD-10-CM | POA: Insufficient documentation

## 2015-01-12 DIAGNOSIS — Z36 Encounter for antenatal screening of mother: Secondary | ICD-10-CM | POA: Insufficient documentation

## 2015-01-12 DIAGNOSIS — Z3402 Encounter for supervision of normal first pregnancy, second trimester: Secondary | ICD-10-CM

## 2015-04-26 ENCOUNTER — Other Ambulatory Visit: Payer: Self-pay | Admitting: Advanced Practice Midwife

## 2015-04-26 DIAGNOSIS — Z3403 Encounter for supervision of normal first pregnancy, third trimester: Secondary | ICD-10-CM

## 2015-04-28 ENCOUNTER — Ambulatory Visit: Payer: Medicaid Other

## 2015-04-28 ENCOUNTER — Ambulatory Visit
Admission: RE | Admit: 2015-04-28 | Discharge: 2015-04-28 | Disposition: A | Discharge status: Home / Self Care | Payer: Medicaid Other | Source: Ambulatory Visit | Attending: Advanced Practice Midwife | Admitting: Advanced Practice Midwife

## 2015-04-28 DIAGNOSIS — Z3A35 35 weeks gestation of pregnancy: Secondary | ICD-10-CM | POA: Insufficient documentation

## 2015-04-28 DIAGNOSIS — Z3403 Encounter for supervision of normal first pregnancy, third trimester: Secondary | ICD-10-CM | POA: Insufficient documentation

## 2015-05-04 ENCOUNTER — Encounter: Payer: Medicaid Other | Admitting: Obstetrics and Gynecology

## 2016-11-06 ENCOUNTER — Other Ambulatory Visit (HOSPITAL_COMMUNITY): Payer: Self-pay | Admitting: Family Medicine

## 2016-11-06 DIAGNOSIS — R51 Headache: Principal | ICD-10-CM

## 2016-11-06 DIAGNOSIS — R519 Headache, unspecified: Secondary | ICD-10-CM

## 2016-11-10 ENCOUNTER — Ambulatory Visit
Admission: RE | Admit: 2016-11-10 | Discharge: 2016-11-10 | Disposition: A | Discharge status: Home / Self Care | Payer: 59 | Source: Ambulatory Visit | Attending: Family Medicine | Admitting: Family Medicine

## 2016-11-10 DIAGNOSIS — R51 Headache: Secondary | ICD-10-CM | POA: Diagnosis present

## 2016-11-10 DIAGNOSIS — R519 Headache, unspecified: Secondary | ICD-10-CM

## 2017-03-20 NOTE — L&D Delivery Note (Signed)
Delivery Note At 7:13 PM a viable female was delivered via Vaginal, Spontaneous (Presentation: vtx;  ).  APGAR:7/8 , ; weight 6 lb 15 oz (3147 g).   Placenta status:[place] , intact , .  Cord:3v  with the following complications: tight nuchal cord - clamped and cut .   Rapid second stage . Female head delivered ROA with a tight nuchal cord . Cord clamped and cut  Delivery of shoulders and body without difficulty . Peds in attendance . Anesthesia:none   Episiotomy: None Lacerations: None Suture Repair: n/a Est. Blood Loss (mL):  200 cc  Mom to postpartum.  Baby to Couplet care / Skin to Skin.  Ihor Austinhomas J Niylah Hassan 03/19/2018, 7:30 PM

## 2017-09-26 LAB — OB RESULTS CONSOLE GC/CHLAMYDIA
Chlamydia: NEGATIVE
Gonorrhea: NEGATIVE

## 2017-09-26 LAB — OB RESULTS CONSOLE RUBELLA ANTIBODY, IGM: Rubella: IMMUNE

## 2017-09-26 LAB — OB RESULTS CONSOLE RPR: RPR: NONREACTIVE

## 2017-09-26 LAB — OB RESULTS CONSOLE VARICELLA ZOSTER ANTIBODY, IGG: Varicella: IMMUNE

## 2017-09-26 LAB — OB RESULTS CONSOLE HEPATITIS B SURFACE ANTIGEN: Hepatitis B Surface Ag: NEGATIVE

## 2017-09-26 LAB — OB RESULTS CONSOLE HIV ANTIBODY (ROUTINE TESTING): HIV: NONREACTIVE

## 2017-09-28 ENCOUNTER — Other Ambulatory Visit: Payer: Self-pay | Admitting: Family Medicine

## 2017-09-28 DIAGNOSIS — Z3480 Encounter for supervision of other normal pregnancy, unspecified trimester: Secondary | ICD-10-CM

## 2017-10-04 ENCOUNTER — Ambulatory Visit
Admission: RE | Admit: 2017-10-04 | Discharge: 2017-10-04 | Disposition: A | Discharge status: Home / Self Care | Payer: BLUE CROSS/BLUE SHIELD | Source: Ambulatory Visit | Attending: Family Medicine | Admitting: Family Medicine

## 2017-10-04 DIAGNOSIS — Z3480 Encounter for supervision of other normal pregnancy, unspecified trimester: Secondary | ICD-10-CM | POA: Diagnosis present

## 2017-10-04 DIAGNOSIS — Z3A18 18 weeks gestation of pregnancy: Secondary | ICD-10-CM | POA: Insufficient documentation

## 2018-03-19 ENCOUNTER — Other Ambulatory Visit: Payer: Self-pay

## 2018-03-19 ENCOUNTER — Inpatient Hospital Stay
Admission: EM | Admit: 2018-03-19 | Discharge: 2018-03-21 | DRG: 806 | Disposition: A | Discharge status: Home / Self Care | Payer: BLUE CROSS/BLUE SHIELD | Attending: Obstetrics and Gynecology | Admitting: Obstetrics and Gynecology

## 2018-03-19 DIAGNOSIS — O9081 Anemia of the puerperium: Secondary | ICD-10-CM | POA: Diagnosis not present

## 2018-03-19 DIAGNOSIS — Z3A40 40 weeks gestation of pregnancy: Secondary | ICD-10-CM

## 2018-03-19 DIAGNOSIS — O99214 Obesity complicating childbirth: Secondary | ICD-10-CM | POA: Diagnosis present

## 2018-03-19 DIAGNOSIS — D62 Acute posthemorrhagic anemia: Secondary | ICD-10-CM | POA: Diagnosis not present

## 2018-03-19 DIAGNOSIS — Z3483 Encounter for supervision of other normal pregnancy, third trimester: Secondary | ICD-10-CM | POA: Diagnosis present

## 2018-03-19 DIAGNOSIS — O99824 Streptococcus B carrier state complicating childbirth: Secondary | ICD-10-CM | POA: Diagnosis present

## 2018-03-19 LAB — COMPREHENSIVE METABOLIC PANEL
ALT: 16 U/L (ref 0–44)
AST: 21 U/L (ref 15–41)
Albumin: 3.5 g/dL (ref 3.5–5.0)
Alkaline Phosphatase: 149 U/L — ABNORMAL HIGH (ref 38–126)
Anion gap: 8 (ref 5–15)
BUN: 10 mg/dL (ref 6–20)
CO2: 20 mmol/L — ABNORMAL LOW (ref 22–32)
Calcium: 8.9 mg/dL (ref 8.9–10.3)
Chloride: 107 mmol/L (ref 98–111)
Creatinine, Ser: 0.57 mg/dL (ref 0.44–1.00)
GFR calc Af Amer: >60 mL/min (ref >60)
GFR calc non Af Amer: >60 mL/min (ref >60)
Glucose, Bld: 87 mg/dL (ref 70–99)
Potassium: 3.6 mmol/L (ref 3.5–5.1)
Sodium: 135 mmol/L (ref 135–145)
Total Bilirubin: 0.9 mg/dL (ref 0.3–1.2)
Total Protein: 6.9 g/dL (ref 6.5–8.1)

## 2018-03-19 LAB — PROTEIN / CREATININE RATIO, URINE
Creatinine, Urine: 40 mg/dL
Total Protein, Urine: <6 mg/dL

## 2018-03-19 LAB — TYPE AND SCREEN
ABO/RH(D): O NEG
Antibody Screen: NEGATIVE

## 2018-03-19 LAB — CBC
HCT: 36.1 % (ref 36.0–46.0)
Hemoglobin: 12.3 g/dL (ref 12.0–15.0)
MCH: 31.3 pg (ref 26.0–34.0)
MCHC: 34.1 g/dL (ref 30.0–36.0)
MCV: 91.9 fL (ref 80.0–100.0)
PLATELETS: 246 10*3/uL (ref 150–400)
RBC: 3.93 MIL/uL (ref 3.87–5.11)
RDW: 14 % (ref 11.5–15.5)
WBC: 10.2 10*3/uL (ref 4.0–10.5)
nRBC: 0 % (ref 0.0–0.2)

## 2018-03-19 MED ORDER — ONDANSETRON HCL 4 MG/2ML IJ SOLN: 4.0000 mg | Freq: Four times a day (QID) | INTRAMUSCULAR | Status: DC | PRN

## 2018-03-19 MED ORDER — LIDOCAINE HCL (PF) 1 % IJ SOLN
30.0000 mL | INTRAMUSCULAR | Status: DC | PRN
  Filled 2018-03-19: qty 30

## 2018-03-19 MED ORDER — OXYTOCIN 40 UNITS IN LACTATED RINGERS INFUSION - SIMPLE MED
2.5000 [IU]/h | INTRAVENOUS | Status: DC
  Filled 2018-03-19: qty 1000

## 2018-03-19 MED ORDER — LACTATED RINGERS IV SOLN
INTRAVENOUS | Status: DC
  Administered 2018-03-19: 12:00:00 via INTRAVENOUS

## 2018-03-19 MED ORDER — IBUPROFEN 600 MG PO TABS
600.0000 mg | ORAL_TABLET | Freq: Four times a day (QID) | ORAL | Status: DC
  Administered 2018-03-19 – 2018-03-21 (×6): 600 mg via ORAL
  Filled 2018-03-19 (×6): qty 1

## 2018-03-19 MED ORDER — DIPHENHYDRAMINE HCL 25 MG PO CAPS: 25.0000 mg | ORAL_CAPSULE | Freq: Four times a day (QID) | ORAL | Status: DC | PRN

## 2018-03-19 MED ORDER — FERROUS SULFATE 325 (65 FE) MG PO TABS
325.0000 mg | ORAL_TABLET | Freq: Two times a day (BID) | ORAL | Status: DC
  Administered 2018-03-20 – 2018-03-21 (×4): 325 mg via ORAL
  Filled 2018-03-19 (×4): qty 1

## 2018-03-19 MED ORDER — LACTATED RINGERS IV SOLN: 500.0000 mL | INTRAVENOUS | Status: DC | PRN

## 2018-03-19 MED ORDER — PRENATAL MULTIVITAMIN CH
1.0000 | ORAL_TABLET | Freq: Every day | ORAL | Status: DC
  Administered 2018-03-20 – 2018-03-21 (×2): 1 via ORAL
  Filled 2018-03-19 (×2): qty 1

## 2018-03-19 MED ORDER — ACETAMINOPHEN 325 MG PO TABS: 650.0000 mg | ORAL_TABLET | ORAL | Status: DC | PRN

## 2018-03-19 MED ORDER — SODIUM CHLORIDE 0.9 % IV SOLN
1.0000 g | INTRAVENOUS | Status: DC
  Administered 2018-03-19: 1 g via INTRAVENOUS
  Filled 2018-03-19 (×5): qty 1000

## 2018-03-19 MED ORDER — MEASLES, MUMPS & RUBELLA VAC IJ SOLR
0.5000 mL | Freq: Once | INTRAMUSCULAR | Status: DC
  Filled 2018-03-19: qty 0.5

## 2018-03-19 MED ORDER — OXYTOCIN 40 UNITS IN LACTATED RINGERS INFUSION - SIMPLE MED
1.0000 m[IU]/min | INTRAVENOUS | Status: DC
  Administered 2018-03-19: 2 m[IU]/min via INTRAVENOUS

## 2018-03-19 MED ORDER — ZOLPIDEM TARTRATE 5 MG PO TABS: 5.0000 mg | ORAL_TABLET | Freq: Every evening | ORAL | Status: DC | PRN

## 2018-03-19 MED ORDER — MAGNESIUM HYDROXIDE 400 MG/5ML PO SUSP: 30.0000 mL | ORAL | Status: DC | PRN

## 2018-03-19 MED ORDER — SENNOSIDES-DOCUSATE SODIUM 8.6-50 MG PO TABS
2.0000 | ORAL_TABLET | ORAL | Status: DC
  Administered 2018-03-20 – 2018-03-21 (×2): 2 via ORAL
  Filled 2018-03-19 (×2): qty 2

## 2018-03-19 MED ORDER — SIMETHICONE 80 MG PO CHEW: 80.0000 mg | CHEWABLE_TABLET | ORAL | Status: DC | PRN

## 2018-03-19 MED ORDER — TERBUTALINE SULFATE 1 MG/ML IJ SOLN: 0.2500 mg | Freq: Once | INTRAMUSCULAR | Status: DC | PRN

## 2018-03-19 MED ORDER — ONDANSETRON HCL 4 MG PO TABS: 4.0000 mg | ORAL_TABLET | ORAL | Status: DC | PRN

## 2018-03-19 MED ORDER — SOD CITRATE-CITRIC ACID 500-334 MG/5ML PO SOLN: 30.0000 mL | ORAL | Status: DC | PRN

## 2018-03-19 MED ORDER — MISOPROSTOL 200 MCG PO TABS
ORAL_TABLET | ORAL | Status: AC
  Filled 2018-03-19: qty 4

## 2018-03-19 MED ORDER — COCONUT OIL OIL: 1.0000 "application " | TOPICAL_OIL | Status: DC | PRN

## 2018-03-19 MED ORDER — OXYTOCIN BOLUS FROM INFUSION: 500.0000 mL | Freq: Once | INTRAVENOUS | Status: DC

## 2018-03-19 MED ORDER — ONDANSETRON HCL 4 MG/2ML IJ SOLN: 4.0000 mg | INTRAMUSCULAR | Status: DC | PRN

## 2018-03-19 MED ORDER — SODIUM CHLORIDE 0.9 % IV SOLN
2.0000 g | Freq: Once | INTRAVENOUS | Status: AC
  Administered 2018-03-19: 2 g via INTRAVENOUS
  Filled 2018-03-19: qty 2000

## 2018-03-19 MED ORDER — DIBUCAINE 1 % RE OINT: 1.0000 "application " | TOPICAL_OINTMENT | RECTAL | Status: DC | PRN

## 2018-03-19 MED ORDER — BENZOCAINE-MENTHOL 20-0.5 % EX AERO
1.0000 "application " | INHALATION_SPRAY | CUTANEOUS | Status: DC | PRN
  Administered 2018-03-20: 1 via TOPICAL
  Filled 2018-03-19: qty 56

## 2018-03-19 MED ORDER — WITCH HAZEL-GLYCERIN EX PADS: 1.0000 "application " | MEDICATED_PAD | CUTANEOUS | Status: DC | PRN

## 2018-03-19 NOTE — H&P (Signed)
OB History & Physical   History of Present Illness:  Chief Complaint: contractions worse since this morning  HPI:  Melanie Dominguez is a 27 y.o. G2P1001 female at 3093w3d dated by LMP and confirmed with US at 18+0wks.  She presents to L&D for more painful UCs since early this morning with bloody show. Reports active FM; onset of ctx last night and currently every 6-8 minutes; reports small amt bloody show, and denies LOF or ROM.   Pregnancy Issues: 1. Obesity 2. Varicella Equivocal- needs varicella at DC 3.  GBS Pos    Maternal Medical History:  History reviewed. No pertinent past medical history.  History reviewed. No pertinent surgical history.  No Known Allergies  Prior to Admission medications   Medication Sig Start Date End Date Taking? Authorizing Provider  omeprazole (PRILOSEC) 10 MG capsule Take 10 mg by mouth daily.   Yes [provider]  Prenatal Vit-Fe Fumarate-FA (PRENATAL MULTIVITAMIN) TABS tablet Take 1 tablet by mouth daily at 12 noon.   Yes [provider]  famotidine (PEPCID) 20 MG tablet Take 1 tablet (20 mg total) by mouth 2 (two) times daily. 12/13/14   Conard NovakJackson, Stephen D, MD  ondansetron (ZOFRAN) 8 MG tablet Take 1 tablet (8 mg total) by mouth every 8 (eight) hours as needed for nausea or vomiting (1st line). 12/13/14   Conard NovakJackson, Stephen D, MD  promethazine (PHENERGAN) 25 MG tablet Take 1 tablet (25 mg total) by mouth every 6 (six) hours as needed for nausea or vomiting. 12/13/14   Conard NovakJackson, Stephen D, MD     [facility]   Social History: She  reports that she has never smoked. She has never used smokeless tobacco. She reports that she does not drink alcohol or use drugs.  Family History: family history is not on file. pts grandmother died of breast cancer, unknown age.   Review of Systems: A full review of systems was performed and negative except as noted in the HPI.     Physical Exam:  Vital Signs: BP 125/82    Pulse 86   Temp 99.1 F (37.3 C) (Oral)   Resp 18   Ht 5\' 2"  (1.575 m)   Wt 78.9 kg   LMP 06/09/2017   BMI 31.83 kg/m  General: no acute distress. Breathing with UCs.  HEENT: normocephalic, atraumatic Heart: regular rate & rhythm.  No murmurs/rubs/gallops Lungs: clear to auscultation bilaterally, normal respiratory effort Abdomen: soft, gravid, non-tender;  EFW: 7.5lbs Pelvic:   External: Normal external female genitalia  Cervix: Dilation: 5 / Effacement (%): 60 / Station: -3  SVE per nursing, soft/posterior cervix.    Extremities: non-tender, symmetric, no edema bilaterally.  DTRs: 2+  Neurologic: Alert & oriented x 3.    Results for orders placed or performed during the hospital encounter of 03/19/18 (from the past 24 hour(s))  CBC     Status: None   Collection Time: 03/19/18 12:11 PM  Result Value Ref Range   WBC 10.2 4.0 - 10.5 K/uL   RBC 3.93 3.87 - 5.11 MIL/uL   Hemoglobin 12.3 12.0 - 15.0 g/dL   HCT 16.136.1 09.636.0 - 04.546.0 %   MCV 91.9 80.0 - 100.0 fL   MCH 31.3 26.0 - 34.0 pg   MCHC 34.1 30.0 - 36.0 g/dL   RDW 40.914.0 81.111.5 - 91.415.5 %   Platelets 246 150 - 400 K/uL   nRBC 0.0 0.0 - 0.2 %  Type and screen [facility]  Status: None (Preliminary result)   Collection Time: 03/19/18 12:11 PM  Result Value Ref Range   ABO/RH(D) PENDING    Antibody Screen PENDING    Sample Expiration      03/22/2018 Performed at [facility], [address] [place], [place] [id number]     Pertinent Results:  Prenatal Labs: Blood type/Rh O neg  Antibody screen neg  Rubella Immune  Varicella  Equivocal  RPR NR  HBsAg Neg  HIV NR  GC neg  Chlamydia neg  Genetic screening negative  1 hour GTT  107  GBS  POS   FHT: 130bpm, mod variability, + accels, no decels TOCO: q6-398min, palpate moderate SVE:  Dilation: 5 / Effacement (%): 60 / Station: -3    Cephalic by leopolds  No results found.  Assessment:  Melanie Dominguez is a 27 y.o. G2P1001  female at 3163w3d with Labor at term.   Plan:  1. Admit to Labor & Delivery; consents reviewed and obtained  2. Fetal Well being  - Fetal Tracing: Cat I tracing - Group B Streptococcus ppx indicated: Positive; 1st dose Ampicillin compelte.  - Presentation: cephalic confirmed by exam and Leopolds   3. Routine OB: - Prenatal labs reviewed, as above - Rh O Negative - CBC, T&S, RPR on admit - Clear fluids, IVF  4. Monitoring of Labor -  Contractions: external toco in place -  Pelvis proven to 8lbs -  Plan for augmentation with AROM if needed -  Plan for continuous fetal monitoring  -  Maternal pain control as desired; declined IVPM or epidural at this time.  - Anticipate vaginal delivery   Randa NgoRebecca A McVey, CNM 03/19/18 1:08 PM

## 2018-03-19 NOTE — Progress Notes (Signed)
Melanie Dominguez is a 27 y.o. G2P1001 at 268w3d  2nd dose of abx in for GBS . More pressure   bySubjective:   Objective: BP 136/73 (BP Location: Right Arm)   Pulse 86   Temp 98.2 F (36.8 C) (Oral)   Resp 18   Ht 5\' 2"  (1.575 m)   Wt 78.9 kg   LMP 06/09/2017   BMI 31.83 kg/m  No intake/output data recorded. Total I/O In: 553.6 [I.V.:453.6; IV Piggyback:100] Out: -   FHT:  FHR: 130 bpm, variability: moderate,  accelerations:  Present,  decelerations:  Absent UC:   irregular, every 5-8 minutes SVE:   Dilation: 7 Effacement (%): 80 Station: -3, -2 Exam by:: k. yates, rn3 AROM by TJS : clear  Exam 7/c/0 VTX  Labs: Lab Results  Component Value Date   WBC 10.2 03/19/2018   HGB 12.3 03/19/2018   HCT 36.1 03/19/2018   MCV 91.9 03/19/2018   PLT 246 03/19/2018    Assessment / Plan: Spontaneous labor, progressing normally  Anticipate SVD shortly   add Pitocin  Ihor Austinhomas J Ansar Skoda 03/19/2018, 5:09 PM

## 2018-03-19 NOTE — Discharge Summary (Signed)
Obstetric Discharge Summary   Patient ID: Patient Name: Melanie Dominguez DOB: 1990/04/11 MRN: [id number]  Date of Admission: 03/19/2018 Date of Delivery: 03/19/18 Delivered by: Ouida Sills, MD Date of Discharge: 03/21/2018  Primary OB:  [facility]  VHQ:IONGEXB'M last menstrual period was 06/09/2017. EDC Estimated Date of Delivery: 03/16/18 Gestational Age at Delivery: [redacted]w[redacted]d  Antepartum complications:elevated LFT elevated  Admitted . Pt could not afford GI eval   Zika negative ( traveled from [place] )  Varicella Non Immune  Admitting Diagnosis:Labor  Secondary Diagnoses: Patient Active Problem List   Diagnosis Date Noted  . Labor and delivery, indication for care 03/19/2018  . Elevated liver enzymes   . Elevated bilirubin   . Hyperemesis gravidarum with metabolic disturbance 084/13/2440   Augmentation: Pitocin Complications: None Intrapartum complications/course:  Delivery Type: spontaneous vaginal delivery Anesthesia: epidural Placenta: spontaneous Laceration: none Episiotomy: none  Newborn Data: Live born female  Birth Weight: 6 lb 15 oz (3147 g) APGAR: , 7/8  Newborn Delivery   Birth date/time:  03/19/2018 19:13:00 Delivery type:  Vaginal, Spontaneous         Postpartum Course  Patient had an uncomplicated postpartum course.  By time of discharge on PPD#2, her pain was controlled on oral pain medications; she had appropriate lochia and was ambulating, voiding without difficulty and tolerating regular diet.  She was deemed stable for discharge to home.       Labs: CBC Latest Ref Rng & Units 03/20/2018 03/19/2018 12/09/2014  WBC 4.0 - 10.5 K/uL 13.4(H) 10.2 13.5(H)  Hemoglobin 12.0 - 15.0 g/dL 11.1(L) 12.3 15.7  Hematocrit 36.0 - 46.0 % 34.2(L) 36.1 45.8  Platelets 150 - 400 K/uL 225 246 340   O NEG  Physical exam:  BP 125/87 (BP Location: Right Arm)   Pulse 91   Temp 98.2 F (36.8 C) (Oral)   Resp 18   Ht _0  (1.575 m)   Wt 78.9 kg   LMP  06/09/2017   SpO2 98%   Breastfeeding Unknown   BMI 31.83 kg/m  General: alert and no distress Pulm: normal respiratory effort Lochia: appropriate Abdomen: soft, NT Uterine Fundus: firm, below umbilicus Perineum: intact, minimal edema Extremities: No evidence of DVT seen on physical exam. No lower extremity edema.   Disposition: stable, discharge to home Baby Feeding: breastmilk Baby Disposition: home with mom  Contraception: undecided, declines Depo or POPs  Prenatal Labs:  Blood type/Rh O neg  Antibody screen neg  Rubella Immune  Varicella  Equivocal  RPR NR  HBsAg Neg  HIV NR  GC neg  Chlamydia neg  Genetic screening negative  1 hour GTT  107  GBS  POS     Varicella given at discharge on 03/21/18 MMR given at discharge-n/a Rh Immune globulin given: done 03/20/18  Plan:  GLinus Galaswas discharged to home in good condition. [facility]  Discharge Instructions: Per After Visit Summary. Activity: Advance as tolerated. Pelvic rest for 6 weeks.   Diet: Regular Discharge Medications: Allergies as of 03/21/2018   No Known Allergies     Medication List    STOP taking these medications   famotidine 20 MG tablet Commonly known as:  PEPCID   omeprazole 10 MG capsule Commonly known as:  PRILOSEC   ondansetron 8 MG tablet Commonly known as:  ZOFRAN   promethazine 25 MG tablet Commonly known as:  PHENERGAN     TAKE these  medications   acetaminophen 325 MG tablet Commonly known as:  TYLENOL Take 2 tablets (650 mg total) by mouth every 4 (four) hours as needed (for pain scale < 4).   ibuprofen 600 MG tablet Commonly known as:  ADVIL,MOTRIN Take 1 tablet (600 mg total) by mouth every 6 (six) hours.   prenatal multivitamin Tabs tablet Take 1 tablet by mouth daily at 12 noon.      Outpatient follow up:  Follow-up Information    Schermerhorn, Gwen Her, MD Follow up in 6  week(s).   Specialty:  Obstetrics and Gynecology Contact information: [address] [place] [address] [phone]            Signed:  Francetta Found 03/21/2018 2:37 PM

## 2018-03-19 NOTE — OB Triage Note (Signed)
Pt here with c/o ctx that started yesterday and have continued throughout the night, report small amt of bloody show, denies ROM, reports positive fetal movement. Has appt at [facility] tonight to schedule IOL

## 2018-03-20 LAB — CBC
HCT: 34.2 % — ABNORMAL LOW (ref 36.0–46.0)
Hemoglobin: 11.1 g/dL — ABNORMAL LOW (ref 12.0–15.0)
MCH: 30.8 pg (ref 26.0–34.0)
MCHC: 32.5 g/dL (ref 30.0–36.0)
MCV: 95 fL (ref 80.0–100.0)
Platelets: 225 10*3/uL (ref 150–400)
RBC: 3.6 MIL/uL — ABNORMAL LOW (ref 3.87–5.11)
RDW: 14.1 % (ref 11.5–15.5)
WBC: 13.4 10*3/uL — ABNORMAL HIGH (ref 4.0–10.5)
nRBC: 0 % (ref 0.0–0.2)

## 2018-03-20 LAB — COMPREHENSIVE METABOLIC PANEL
ALK PHOS: 125 U/L (ref 38–126)
ALT: 16 U/L (ref 0–44)
ANION GAP: 8 (ref 5–15)
AST: 22 U/L (ref 15–41)
Albumin: 2.6 g/dL — ABNORMAL LOW (ref 3.5–5.0)
BUN: 8 mg/dL (ref 6–20)
CO2: 21 mmol/L — ABNORMAL LOW (ref 22–32)
Calcium: 8.2 mg/dL — ABNORMAL LOW (ref 8.9–10.3)
Chloride: 103 mmol/L (ref 98–111)
Creatinine, Ser: 0.49 mg/dL (ref 0.44–1.00)
GFR calc Af Amer: >60 mL/min (ref >60)
GFR calc non Af Amer: >60 mL/min (ref >60)
Glucose, Bld: 95 mg/dL (ref 70–99)
Potassium: 3.6 mmol/L (ref 3.5–5.1)
Sodium: 132 mmol/L — ABNORMAL LOW (ref 135–145)
Total Bilirubin: 1.4 mg/dL — ABNORMAL HIGH (ref 0.3–1.2)
Total Protein: 5.4 g/dL — ABNORMAL LOW (ref 6.5–8.1)

## 2018-03-20 LAB — RPR: RPR Ser Ql: NONREACTIVE

## 2018-03-20 LAB — FETAL SCREEN: Fetal Screen: NEGATIVE

## 2018-03-20 MED ORDER — RHO D IMMUNE GLOBULIN 1500 UNIT/2ML IJ SOSY
300.0000 ug | PREFILLED_SYRINGE | Freq: Once | INTRAMUSCULAR | Status: AC
  Administered 2018-03-20: 300 ug via INTRAVENOUS
  Filled 2018-03-20: qty 2

## 2018-03-20 NOTE — Plan of Care (Signed)
Patient's vital signs stable; fundus firm; small amount rubra lochia; good appetite; good po fluids;; IV site clear; breastfeeding infant with good technique; voiding; husband speaks [place] and has been at bedside; patient understands some [place]; patient v/u to let nurse know if she needs an interpreter.

## 2018-03-20 NOTE — Lactation Note (Addendum)
This note was copied from a baby's chart. Lactation Consultation Note  Patient Name: Girl Branda Chaudhary CVUDT'H Date: 03/20/2018 Reason for consult: Initial assessment   Maternal Data    Feeding Feeding Type: Breast Fed  LATCH Score                   Interventions Interventions: Breast feeding basics reviewed  Lactation Tools Discussed/Used     Consult Status Consult Status: Follow-up Date: 03/20/18 [facility] spoke with mother via interpreter to check the progress of breastfeeding. Mother states that infant is breastfeeding well with no discomfort or pain with swallows heard. Mother denies any concerns at this time.  LC returned to assist with latch. Parents are concerned because infant breast-fed for a long time (45 min to 1 hr) and did not seem satisfied. Infant could not seem to open mouth wide enough to get a deep latch. Mother states that infant seems to be pinching her nipple. After attempting again to latch, infant was able to latch but no swallows were heard and sucking pattern was fast as if sucking on a pacifier. Parents desire to give infant formula until mother is able to establish a supply but infant choked and gagged when attempted to give a bottle. Pump kit in room and mother has initiated pumping.  Elvera Lennox 06/20/8885, 11:36 AM

## 2018-03-20 NOTE — Progress Notes (Signed)
Alicia from lactation notified of mother's concern about breastfeeding and went to bedside to assess feed and discuss POC.

## 2018-03-20 NOTE — Progress Notes (Signed)
Post Partum Day 1 Subjective: no complaints, up ad lib and voiding  Objective: Blood pressure 103/65, pulse 80, temperature 98.3 F (36.8 C), temperature source Oral, resp. rate 18, height 5\' 2"  (1.575 m), weight 78.9 kg, last menstrual period 06/09/2017, SpO2 98 %, unknown if currently breastfeeding.  Physical Exam:  General: alert and cooperative Lochia: appropriate Uterine Fundus: firm Incision: healing well, no significant drainage, no dehiscence DVT Evaluation: No evidence of DVT seen on physical exam.  Recent Labs    03/19/18 1211 03/20/18 0339  HGB 12.3 11.1*  HCT 36.1 34.2*    Assessment/Plan: Plan for discharge tomorrow  Acute blood loss anemia: po iron Varicella equiv: vaccination at discharge   LOS: 1 day   Christeen Douglas 03/20/2018, 9:31 AM

## 2018-03-21 LAB — RHOGAM INJECTION: Unit division: 0

## 2018-03-21 MED ORDER — IBUPROFEN 600 MG PO TABS: 600.0000 mg | ORAL_TABLET | Freq: Four times a day (QID) | ORAL | 0 refills | Status: AC

## 2018-03-21 MED ORDER — ACETAMINOPHEN 325 MG PO TABS: 650.0000 mg | ORAL_TABLET | ORAL | Status: AC | PRN

## 2018-03-21 MED ORDER — VARICELLA VIRUS VACCINE LIVE 1350 PFU/0.5ML IJ SUSR
0.5000 mL | Freq: Once | INTRAMUSCULAR | Status: AC
  Administered 2018-03-21: 0.5 mL via SUBCUTANEOUS
  Filled 2018-03-21 (×2): qty 0.5

## 2018-03-21 NOTE — Progress Notes (Signed)
Post Partum Day 2 Subjective: Doing well, no complaints.  Tolerating regular diet, pain with PO meds, voiding and ambulating without difficulty.  No CP SOB Fever,Chills, N/V or leg pain; denies nipple or breast pain, no HA change of vision, RUQ/epigastric pain  Objective: BP 125/87 (BP Location: Right Arm)   Pulse 91   Temp 98.2 F (36.8 C) (Oral)   Resp 18   Ht 5\' 2"  (1.575 m)   Wt 78.9 kg   LMP 06/09/2017   SpO2 98%   Breastfeeding Unknown   BMI 31.83 kg/m    Physical Exam:  General: NAD Breasts: soft/nontender CV: RRR Pulm: nl effort, CTABL Abdomen: soft, NT, BS x 4 Perineum: minimal edema, intact Lochia: small Uterine Fundus: fundus firm and 2 fb below umbilicus DVT Evaluation: no cords, ttp LEs   Recent Labs    03/19/18 1211 03/20/18 0339  HGB 12.3 11.1*  HCT 36.1 34.2*  WBC 10.2 13.4*  PLT 246 225    Assessment/Plan: 28 y.o. G2P2002 postpartum day # 2  - Continue routine PP care - Lactation consult - Discussed contraceptive options including implant, IUDs hormonal and non-hormonal, injection, pills/ring/patch, condoms, and NFP.  - Acute blood loss anemia - hemodynamically stable and asymptomatic; start po ferrous sulfate BID with stool softeners  - Immunization status: Needs varicella prior to DC- DONE  Disposition: Does desire Dc home today.     Randa Ngo, CNM 03/21/2018 2:32 PM

## 2018-03-21 NOTE — Progress Notes (Signed)
Patient discharged home with infant. Discharge instructions, prescriptions and follow up appointment given to and reviewed with patient. Patient verbalized understanding.  Patient eating dinner then will be escorted out

## 2021-03-20 NOTE — L&D Delivery Note (Addendum)
Delivery Note  First Stage: Labor onset: 1815 Augmentation : none Analgesia /Anesthesia intrapartum: none SROM at 0322  Second Stage: Complete dilation at 0658 Onset of pushing at 0658 FHR second stage Cat II  Delivery of a viable female 03/29/2021 at 0704 by Melanie Dominguez, CNM delivery of fetal head in OA position with restitution to ROA. no nuchal cord;  Anterior then posterior shoulders delivered easily with gentle downward traction. Baby placed on mom's chest, and attended to by peds.  Cord double clamped after cessation of pulsation, cut by mother Cord blood sample collected   Third Stage: Placenta delivered Melanie Dominguez intact with 3 VC @ [phone] Placenta disposition: discarded Uterine tone firm / bleeding light  no laceration identified  Anesthesia for repair: none Repair none Est. Blood Loss (mL): [phone]  Complications: no PNC, uncertain dates  Mom to postpartum.  Baby to Couplet care / Skin to Skin.  Newborn: Birth Weight: 2900g  Apgar Scores: 8, 9 Feeding planned: breast and bottle

## 2021-03-28 ENCOUNTER — Observation Stay: Payer: Medicaid Other

## 2021-03-28 ENCOUNTER — Encounter: Payer: Self-pay | Admitting: Obstetrics and Gynecology

## 2021-03-28 ENCOUNTER — Inpatient Hospital Stay
Admission: EM | Admit: 2021-03-28 | Discharge: 2021-03-30 | DRG: 806 | Disposition: A | Discharge status: Home / Self Care | Payer: Medicaid Other | Attending: Certified Nurse Midwife | Admitting: Certified Nurse Midwife

## 2021-03-28 DIAGNOSIS — O4103X0 Oligohydramnios, third trimester, not applicable or unspecified: Secondary | ICD-10-CM | POA: Diagnosis present

## 2021-03-28 DIAGNOSIS — Z3A34 34 weeks gestation of pregnancy: Secondary | ICD-10-CM

## 2021-03-28 DIAGNOSIS — O26893 Other specified pregnancy related conditions, third trimester: Secondary | ICD-10-CM | POA: Diagnosis present

## 2021-03-28 DIAGNOSIS — N898 Other specified noninflammatory disorders of vagina: Secondary | ICD-10-CM | POA: Diagnosis present

## 2021-03-28 DIAGNOSIS — Z20822 Contact with and (suspected) exposure to covid-19: Secondary | ICD-10-CM | POA: Diagnosis present

## 2021-03-28 DIAGNOSIS — Z23 Encounter for immunization: Secondary | ICD-10-CM

## 2021-03-28 LAB — URINALYSIS, ROUTINE W REFLEX MICROSCOPIC
Bacteria, UA: NONE SEEN
Bilirubin Urine: NEGATIVE
Glucose, UA: NEGATIVE mg/dL
Ketones, ur: NEGATIVE mg/dL
Leukocytes,Ua: NEGATIVE
Nitrite: NEGATIVE
Protein, ur: NEGATIVE mg/dL
Specific Gravity, Urine: 1.008 (ref 1.005–1.030)
pH: 8 (ref 5.0–8.0)

## 2021-03-28 LAB — URINE DRUG SCREEN, QUALITATIVE (ARMC ONLY)
Amphetamines, Ur Screen: NOT DETECTED
Barbiturates, Ur Screen: NOT DETECTED
Benzodiazepine, Ur Scrn: NOT DETECTED
Cannabinoid 50 Ng, Ur ~~LOC~~: NOT DETECTED
Cocaine Metabolite,Ur ~~LOC~~: NOT DETECTED
MDMA (Ecstasy)Ur Screen: NOT DETECTED
Methadone Scn, Ur: NOT DETECTED
Opiate, Ur Screen: NOT DETECTED
Phencyclidine (PCP) Ur S: NOT DETECTED
Tricyclic, Ur Screen: NOT DETECTED

## 2021-03-28 LAB — COMPREHENSIVE METABOLIC PANEL
ALT: 13 U/L (ref 0–44)
AST: 17 U/L (ref 15–41)
Albumin: 3.2 g/dL — ABNORMAL LOW (ref 3.5–5.0)
Alkaline Phosphatase: 91 U/L (ref 38–126)
Anion gap: 8 (ref 5–15)
BUN: 6 mg/dL (ref 6–20)
CO2: 24 mmol/L (ref 22–32)
Calcium: 8.9 mg/dL (ref 8.9–10.3)
Chloride: 104 mmol/L (ref 98–111)
Creatinine, Ser: 0.56 mg/dL (ref 0.44–1.00)
GFR, Estimated: >60 mL/min (ref >60)
Glucose, Bld: 120 mg/dL — ABNORMAL HIGH (ref 70–99)
Potassium: 3.7 mmol/L (ref 3.5–5.1)
Sodium: 136 mmol/L (ref 135–145)
Total Bilirubin: 0.5 mg/dL (ref 0.3–1.2)
Total Protein: 6.3 g/dL — ABNORMAL LOW (ref 6.5–8.1)

## 2021-03-28 LAB — DIFFERENTIAL
Abs Immature Granulocytes: 0.06 10*3/uL (ref 0.00–0.07)
Basophils Absolute: 0 10*3/uL (ref 0.0–0.1)
Basophils Relative: 0 %
Eosinophils Absolute: 0 10*3/uL (ref 0.0–0.5)
Eosinophils Relative: 0 %
Immature Granulocytes: 1 %
Lymphocytes Relative: 7 %
Lymphs Abs: 0.6 10*3/uL — ABNORMAL LOW (ref 0.7–4.0)
Monocytes Absolute: 0.6 10*3/uL (ref 0.1–1.0)
Monocytes Relative: 6 %
Neutro Abs: 7.6 10*3/uL (ref 1.7–7.7)
Neutrophils Relative %: 86 %

## 2021-03-28 LAB — WET PREP, GENITAL
Clue Cells Wet Prep HPF POC: NONE SEEN
Sperm: NONE SEEN
Trich, Wet Prep: NONE SEEN
WBC, Wet Prep HPF POC: >10 — AB (ref <10)
Yeast Wet Prep HPF POC: NONE SEEN

## 2021-03-28 LAB — CBC
HCT: 33.2 % — ABNORMAL LOW (ref 36.0–46.0)
Hemoglobin: 11.1 g/dL — ABNORMAL LOW (ref 12.0–15.0)
MCH: 30.2 pg (ref 26.0–34.0)
MCHC: 33.4 g/dL (ref 30.0–36.0)
MCV: 90.5 fL (ref 80.0–100.0)
Platelets: 205 10*3/uL (ref 150–400)
RBC: 3.67 MIL/uL — ABNORMAL LOW (ref 3.87–5.11)
RDW: 14.3 % (ref 11.5–15.5)
WBC: 8.8 10*3/uL (ref 4.0–10.5)
nRBC: 0 % (ref 0.0–0.2)

## 2021-03-28 LAB — PROTEIN / CREATININE RATIO, URINE
Creatinine, Urine: 56 mg/dL
Total Protein, Urine: <6 mg/dL

## 2021-03-28 LAB — TYPE AND SCREEN
ABO/RH(D): O NEG
Antibody Screen: NEGATIVE

## 2021-03-28 LAB — CHLAMYDIA/NGC RT PCR (ARMC ONLY)
Chlamydia Tr: NOT DETECTED
N gonorrhoeae: NOT DETECTED

## 2021-03-28 LAB — RAPID HIV SCREEN (HIV 1/2 AB+AG)
HIV 1/2 Antibodies: NONREACTIVE
HIV-1 P24 Antigen - HIV24: NONREACTIVE

## 2021-03-28 LAB — OB RESULTS CONSOLE GC/CHLAMYDIA
Chlamydia: NEGATIVE
Gonorrhea: NEGATIVE

## 2021-03-28 LAB — OB RESULTS CONSOLE HIV ANTIBODY (ROUTINE TESTING): HIV: NONREACTIVE

## 2021-03-28 LAB — GLUCOSE, CAPILLARY: Glucose-Capillary: 118 mg/dL — ABNORMAL HIGH (ref 70–99)

## 2021-03-28 LAB — TSH: TSH: 2.347 u[IU]/mL (ref 0.350–4.500)

## 2021-03-28 LAB — GROUP B STREP BY PCR: Group B strep by PCR: NEGATIVE

## 2021-03-28 LAB — HEPATITIS B SURFACE ANTIGEN: Hepatitis B Surface Ag: NONREACTIVE

## 2021-03-28 MED ORDER — CALCIUM CARBONATE ANTACID 500 MG PO CHEW: 2.0000 | CHEWABLE_TABLET | ORAL | Status: DC | PRN

## 2021-03-28 MED ORDER — ACETAMINOPHEN 325 MG PO TABS: 650.0000 mg | ORAL_TABLET | ORAL | Status: DC | PRN

## 2021-03-28 MED ORDER — RHO D IMMUNE GLOBULIN 1500 UNIT/2ML IJ SOSY
300.0000 ug | PREFILLED_SYRINGE | Freq: Once | INTRAMUSCULAR | Status: DC
  Filled 2021-03-28: qty 2

## 2021-03-28 MED ORDER — BETAMETHASONE SOD PHOS & ACET 6 (3-3) MG/ML IJ SUSP
12.0000 mg | INTRAMUSCULAR | Status: DC
  Administered 2021-03-28: 12 mg via INTRAMUSCULAR
  Filled 2021-03-28: qty 5

## 2021-03-28 MED ORDER — DOCUSATE SODIUM 100 MG PO CAPS
100.0000 mg | ORAL_CAPSULE | Freq: Every day | ORAL | Status: DC
  Administered 2021-03-29: 100 mg via ORAL
  Filled 2021-03-28: qty 1

## 2021-03-28 MED ORDER — PRENATAL MULTIVITAMIN CH
1.0000 | ORAL_TABLET | Freq: Every day | ORAL | Status: DC
  Administered 2021-03-29: 1 via ORAL
  Filled 2021-03-28: qty 1

## 2021-03-28 NOTE — OB Triage Note (Signed)
Pt G3P2 presents for vaginal bleeding for 2 days. Pt reports she changed her pad 3 times yesterday and once today. Reports bleeding as bright red and mucousy. Reports ctx for the past two days that were 15-20 minutes apart but have eased off today. +FM. Pt has not established PNC yet. Has appt at [facility] this week. LMP 07/02/20 but had some spotting in may and June. VSS. Monitors applied.

## 2021-03-28 NOTE — Progress Notes (Signed)
Triage Progress Note  Melanie Dominguez is a 31 y.o. G3P2002 at [redacted]w[redacted]d by ultrasound in observation for vaginal bleeding  Subjective: She is now experiencing contractions and states she feels like she is in labor.  Objective: BP 136/82 (BP Location: Right Arm)    Pulse (!) 114    Temp 98.3 F (36.8 C) (Oral)    Resp 16    LMP 07/02/2020  Notable VS details: reviewed  Fetal Assessment: FHT:  FHR: 145 bpm, variability: moderate,  accelerations:  Present,  decelerations:  Absent Category/reactivity:  Category I UC:   regular, every 4-5 minutes SVE:    Dilation: 3cm  Effacement: 80%  Station:  -1  Consistency: soft  Position: middle  Membrane status:intact  Labs: Lab Results  Component Value Date   WBC 8.8 03/28/2021   HGB 11.1 (L) 03/28/2021   HCT 33.2 (L) 03/28/2021   MCV 90.5 03/28/2021   PLT 205 03/28/2021    Assessment / Plan: High risk pregnancy in third trimester  Uterine Contractions:  She is having painful contractions and her cervix is now 3/80/-1. Discussed with Dr. Jean Rosenthal and the NNP the need for BMZ and how if she is actually 38wks by LMP instead of 34wks, she does not need BMZ. Dr. Jean Rosenthal states to present [facility] with the options, benefits, risks, and let her make the decision. Discussed in depth with Nakshatra the benefits, risks, and alternatives to BMZ given at both 34 or 38wks. After considering her options and discussing with her husband, Anwitha requests BMZ for fetal lung maturity.   Will recheck cervix in 2hrs to determine active labor vs preterm contractions. GBS negative. Advised to wait to give Rhogam until we know if she is laboring or not.  Janyce Llanos, CNM 03/28/2021, 9:25 PM

## 2021-03-28 NOTE — H&P (Signed)
OB History & Physical   History of Present Illness:  Chief Complaint:   HPI:  Melanie Dominguez is a 31 y.o. G81P2002 female at [redacted]w[redacted]d dated by [place] today.  She presents to L&D for vaginal bleeding and then began having uterine contractions.  Active FM onset of ctx @ 1815 currently every 3-4 minutes bloody show present    Pregnancy Issues: 1. No PNC 2. Uncertain dating   Maternal Medical History:  History reviewed. No pertinent past medical history.  History reviewed. No pertinent surgical history.  No Known Allergies  Prior to Admission medications   Medication Sig Start Date End Date Taking? Authorizing Provider  acetaminophen (TYLENOL) 325 MG tablet Take 2 tablets (650 mg total) by mouth every 4 (four) hours as needed (for pain scale < 4). 03/21/18   McVey, Prudencio Pair, CNM  ibuprofen (ADVIL,MOTRIN) 600 MG tablet Take 1 tablet (600 mg total) by mouth every 6 (six) hours. 03/21/18   McVey, Prudencio Pair, CNM  Prenatal Vit-Fe Fumarate-FA (PRENATAL MULTIVITAMIN) TABS tablet Take 1 tablet by mouth daily at 12 noon.    [provider]     Prenatal care site: none  Social History: She  reports that she has never smoked. She has never used smokeless tobacco. She reports that she does not drink alcohol and does not use drugs.  Family History: family history is not on file.   Review of Systems: A full review of systems was performed and negative except as noted in the HPI.     Physical Exam:  Vital Signs: BP 123/86 (BP Location: Right Arm)    Pulse (!) 110    Temp 98.9 F (37.2 C) (Oral)    Resp 17    LMP 07/02/2020  General: no acute distress.  HEENT: normocephalic, atraumatic Heart: regular rate & rhythm.  No murmurs/rubs/gallops Lungs: clear to auscultation bilaterally, normal respiratory effort Abdomen: soft, gravid, non-tender;  EFW: 5lb Pelvic:   External: Normal external female genitalia  Cervix: Dilation: 4 / Effacement (%): 90 / Station: -1    Extremities:  non-tender, symmetric, min edema bilaterally.  DTRs: +2  Neurologic: Alert & oriented x 3.    Results for orders placed or performed during the hospital encounter of 03/28/21 (from the past 24 hour(s))  Urinalysis, Routine w reflex microscopic Urine, Clean Catch     Status: Abnormal   Collection Time: 03/28/21  3:30 PM  Result Value Ref Range   Color, Urine YELLOW (A) YELLOW   APPearance CLEAR (A) CLEAR   Specific Gravity, Urine 1.008 1.005 - 1.030   pH 8.0 5.0 - 8.0   Glucose, UA NEGATIVE NEGATIVE mg/dL   Hgb urine dipstick MODERATE (A) NEGATIVE   Bilirubin Urine NEGATIVE NEGATIVE   Ketones, ur NEGATIVE NEGATIVE mg/dL   Protein, ur NEGATIVE NEGATIVE mg/dL   Nitrite NEGATIVE NEGATIVE   Leukocytes,Ua NEGATIVE NEGATIVE   RBC / HPF 6-10 0 - 5 RBC/hpf   WBC, UA 0-5 0 - 5 WBC/hpf   Bacteria, UA NONE SEEN NONE SEEN   Squamous Epithelial / LPF 0-5 0 - 5   Mucus PRESENT   Protein / creatinine ratio, urine     Status: None   Collection Time: 03/28/21  3:30 PM  Result Value Ref Range   Creatinine, Urine 56 mg/dL   Total Protein, Urine <6 mg/dL   Protein Creatinine Ratio        0.00 - 0.15 mg/mg[Cre]  Urine Drug Screen, Qualitative ([facility] only)  Status: None   Collection Time: 03/28/21  3:41 PM  Result Value Ref Range   Tricyclic, Ur Screen NONE DETECTED NONE DETECTED   Amphetamines, Ur Screen NONE DETECTED NONE DETECTED   MDMA (Ecstasy)Ur Screen NONE DETECTED NONE DETECTED   Cocaine Metabolite,Ur [place] NONE DETECTED NONE DETECTED   Opiate, Ur Screen NONE DETECTED NONE DETECTED   Phencyclidine (PCP) Ur S NONE DETECTED NONE DETECTED   Cannabinoid 50 Ng, Ur [place] NONE DETECTED NONE DETECTED   Barbiturates, Ur Screen NONE DETECTED NONE DETECTED   Benzodiazepine, Ur Scrn NONE DETECTED NONE DETECTED   Methadone Scn, Ur NONE DETECTED NONE DETECTED  CBC     Status: Abnormal   Collection Time: 03/28/21  3:46 PM  Result Value Ref Range   WBC 8.8 4.0 - 10.5 K/uL   RBC 3.67 (L) 3.87 - 5.11 MIL/uL    Hemoglobin 11.1 (L) 12.0 - 15.0 g/dL   HCT 70.9 (L) 62.8 - 36.6 %   MCV 90.5 80.0 - 100.0 fL   MCH 30.2 26.0 - 34.0 pg   MCHC 33.4 30.0 - 36.0 g/dL   RDW 29.4 76.5 - 46.5 %   Platelets 205 150 - 400 K/uL   nRBC 0.0 0.0 - 0.2 %  Differential     Status: Abnormal   Collection Time: 03/28/21  3:46 PM  Result Value Ref Range   Neutrophils Relative % 86 %   Neutro Abs 7.6 1.7 - 7.7 K/uL   Lymphocytes Relative 7 %   Lymphs Abs 0.6 (L) 0.7 - 4.0 K/uL   Monocytes Relative 6 %   Monocytes Absolute 0.6 0.1 - 1.0 K/uL   Eosinophils Relative 0 %   Eosinophils Absolute 0.0 0.0 - 0.5 K/uL   Basophils Relative 0 %   Basophils Absolute 0.0 0.0 - 0.1 K/uL   Immature Granulocytes 1 %   Abs Immature Granulocytes 0.06 0.00 - 0.07 K/uL  Type and screen [facility]     Status: None   Collection Time: 03/28/21  3:46 PM  Result Value Ref Range   ABO/RH(D) O NEG    Antibody Screen NEG    Sample Expiration      03/31/2021,2359 Performed at [facility], [address] [place], [place] [address]   Rapid HIV screen (HIV 1/2 Ab+Ag)     Status: None   Collection Time: 03/28/21  3:46 PM  Result Value Ref Range   HIV-1 P24 Antigen - HIV24 NON REACTIVE NON REACTIVE   HIV 1/2 Antibodies NON REACTIVE NON REACTIVE   Interpretation (HIV Ag Ab)      A non reactive test result means that HIV 1 or HIV 2 antibodies and HIV 1 p24 antigen were not detected in the specimen.  Hepatitis B surface antigen     Status: None   Collection Time: 03/28/21  3:46 PM  Result Value Ref Range   Hepatitis B Surface Ag NON REACTIVE NON REACTIVE  Comprehensive metabolic panel     Status: Abnormal   Collection Time: 03/28/21  3:46 PM  Result Value Ref Range   Sodium 136 135 - 145 mmol/L   Potassium 3.7 3.5 - 5.1 mmol/L   Chloride 104 98 - 111 mmol/L   CO2 24 22 - 32 mmol/L   Glucose, Bld 120 (H) 70 - 99 mg/dL   BUN 6 6 - 20 mg/dL   Creatinine, Ser 5.68 0.44 - 1.00 mg/dL   Calcium 8.9 8.9 -  12.7 mg/dL   Total Protein 6.3 (  L) 6.5 - 8.1 g/dL   Albumin 3.2 (L) 3.5 - 5.0 g/dL   AST 17 15 - 41 U/L   ALT 13 0 - 44 U/L   Alkaline Phosphatase 91 38 - 126 U/L   Total Bilirubin 0.5 0.3 - 1.2 mg/dL   GFR, Estimated >16>60 >10>60 mL/min   Anion gap 8 5 - 15  TSH     Status: None   Collection Time: 03/28/21  3:46 PM  Result Value Ref Range   TSH 2.347 0.350 - 4.500 uIU/mL  Wet prep, genital     Status: Abnormal   Collection Time: 03/28/21  6:21 PM   Specimen: Vaginal/Rectal  Result Value Ref Range   Yeast Wet Prep HPF POC NONE SEEN NONE SEEN   Trich, Wet Prep NONE SEEN NONE SEEN   Clue Cells Wet Prep HPF POC NONE SEEN NONE SEEN   WBC, Wet Prep HPF POC >10 (A) <10   Sperm NONE SEEN   Chlamydia/NGC rt PCR ([facility] only)     Status: None   Collection Time: 03/28/21  6:21 PM   Specimen: Vaginal/Rectal  Result Value Ref Range   Specimen source GC/Chlam ENDOCERVICAL    Chlamydia Tr NOT DETECTED NOT DETECTED   N gonorrhoeae NOT DETECTED NOT DETECTED  Group B strep by PCR     Status: None   Collection Time: 03/28/21  6:21 PM   Specimen: Vaginal/Rectal; Genital  Result Value Ref Range   Group B strep by PCR NEGATIVE NEGATIVE  Glucose, capillary     Status: Abnormal   Collection Time: 03/28/21 11:17 PM  Result Value Ref Range   Glucose-Capillary 118 (H) 70 - 99 mg/dL    Pertinent Results:  Prenatal Labs: Blood type/Rh O neg (never received Rhogam)  Antibody screen neg  Rubella pending  Varicella pending  RPR pending  HBsAg Neg  HIV NR  GC neg  Chlamydia neg  Genetic screening negative  1 hour GTT Not performed  3 hour GTT Not performed  GBS Negative by PCR   FHT: Category I, 155bpm, moderate variability, accels present, no decelerations TOCO: regular contractions q4-135min SVE:  Dilation: 4 / Effacement (%): 90 / Station: -1    Cephalic by US, c/w SVE  US OB Comp + 14 Wk  Result Date: 03/28/2021 CLINICAL DATA:  No prenatal care. Vaginal discharge and bleeding in 3rd  trimester. EXAM: OBSTETRICAL ULTRASOUND >14 WKS FINDINGS: Number of Fetuses: 1 Heart Rate:  156 bpm Movement: Yes Presentation: Cephalic Previa: No Placental Location: Anterior Amniotic Fluid (Subjective): Decreased Amniotic Fluid (Objective): AFI = 2.4 cm (5%ile= 7.0 cm, 95%= 24.8 cm for 34 wks) FETAL BIOMETRY BPD: 8.4cm 33w 6d HC:   31.0cm 34w 5d AC:   31.5cm 35w 3d FL:   6.9cm 35w 2d Current Mean GA: 34w tod US EDC: 05/07/2021 Estimated Fetal Weight:  2,606g FETAL ANATOMY Lateral Ventricles: Appears normal Thalami/CSP: Appears normal Posterior Fossa:  Appears normal Nuchal Region: Not visualized   NFT= N/A > 20 WKS Upper Lip: Not visualized Spine: Not visualized 4 Chamber Heart on Left: Appears normal LVOT: Not visualized RVOT: Not visualized Stomach on Left: Appears normal 3 Vessel Cord: Not visualized Cord Insertion site: Not visualized Kidneys: Appears normal Bladder: Appears normal Extremities: Appears normal Sex: Not Visualized Technically difficult due to: Advanced gestational age, fetal position, and decreased amniotic fluid Maternal Findings: Cervix:  3.7 cm TA IMPRESSION: Single living IUP with estimated gestational age of [redacted] weeks 2 days, and US EDC of 05/07/2021.  Limited fetal anatomic evaluation, however visualized fetal anatomy is unremarkable. Decreased amniotic fluid volume, with AFI of 2.4 cm. Normal cervical length.  No placenta previa or abruption identified. Electronically Signed   By: Danae OrleansJohn A Stahl M.D.   On: 03/28/2021 17:56    Assessment:  Melanie Dominguez is a 31 y.o. 603P2002 female at 4939w2d with uterine contractions and vaginal bleeding.   Plan:  1. Admit to Labor & Delivery; consents reviewed and obtained  2. Fetal Well being  - Fetal Tracing: Category I  - Group B Streptococcus ppx indicated: n/a, GBS negative by PCR - Presentation: vertex confirmed by US and SVE   3. Routine OB: - Prenatal labs reviewed, as above - Rh negative - CBC, T&S, RPR on admit - Clear  fluids, saline lock  4. Monitoring of Labor -  Contractions q3-695min, external toco in place -  She will progress on her own, no augmentation d/t possible preterm contractions -  Pelvis proven to 3147g -  Plan for continuous fetal monitoring  -  Maternal pain control as desired; declines pain medication - Anticipate vaginal delivery  5. Post Partum Planning: - Infant feeding: breast and bottle - Contraception: TBD  Janyce Llanosanielle Renee Halsey Hammen, CNM 03/29/21 12:00 AM

## 2021-03-28 NOTE — OB Triage Note (Addendum)
Melanie Dominguez is a 31 y.o. female. She is at [redacted]w[redacted]d gestation. Patient's last menstrual period was 07/02/2020. Estimated Date of Delivery: 04/08/21  Prenatal care site: none  Current pregnancy complicated by: no prenatal care  Chief complaint: She believes she is about [redacted]wks pregnant with no prenatal care experiencing vaginal bleeding x2 days. Yesterday she had to change her pad 3 times with bright red vaginal bleeding, today is only noting vaginal spotting with wiping. Occasional contractions, denies leaking fluid. Occasional burning with urination.  She had a normal period for her 07/02/2020 which puts her at 38wks. She had spotting in May and June 2022 and nothing since. She states she is planning on being seen at the [facility].  She is a [id number] with a history of normal pregnancies and uncomplicated vaginal births, per her report.  S: Resting comfortably. no CTX, no LOF,  Active fetal movement. Denies: HA, visual changes, SOB, or RUQ/epigastric pain  Maternal Medical History:  History reviewed. No pertinent past medical history.  History reviewed. No pertinent surgical history.  No Known Allergies  Prior to Admission medications   Medication Sig Start Date End Date Taking? Authorizing Provider  acetaminophen (TYLENOL) 325 MG tablet Take 2 tablets (650 mg total) by mouth every 4 (four) hours as needed (for pain scale < 4). 03/21/18   McVey, Prudencio Pair, CNM  ibuprofen (ADVIL,MOTRIN) 600 MG tablet Take 1 tablet (600 mg total) by mouth every 6 (six) hours. 03/21/18   McVey, Prudencio Pair, CNM  Prenatal Vit-Fe Fumarate-FA (PRENATAL MULTIVITAMIN) TABS tablet Take 1 tablet by mouth daily at 12 noon.    [provider]    Social History: She  reports that she has never smoked. She has never used smokeless tobacco. She reports that she does not drink alcohol and does not use drugs.  Family History: family history is not on file.  no history of gyn cancers  Review of Systems: A full  review of systems was performed and negative except as noted in the HPI.     O:  BP 131/80    Pulse (!) 115    Temp 98.4 F (36.9 C) (Oral)    Resp 17    LMP 07/02/2020  Results for orders placed or performed during the hospital encounter of 03/28/21 (from the past 48 hour(s))  Urinalysis, Routine w reflex microscopic Urine, Clean Catch   Collection Time: 03/28/21  3:30 PM  Result Value Ref Range   Color, Urine YELLOW (A) YELLOW   APPearance CLEAR (A) CLEAR   Specific Gravity, Urine 1.008 1.005 - 1.030   pH 8.0 5.0 - 8.0   Glucose, UA NEGATIVE NEGATIVE mg/dL   Hgb urine dipstick MODERATE (A) NEGATIVE   Bilirubin Urine NEGATIVE NEGATIVE   Ketones, ur NEGATIVE NEGATIVE mg/dL   Protein, ur NEGATIVE NEGATIVE mg/dL   Nitrite NEGATIVE NEGATIVE   Leukocytes,Ua NEGATIVE NEGATIVE   RBC / HPF 6-10 0 - 5 RBC/hpf   WBC, UA 0-5 0 - 5 WBC/hpf   Bacteria, UA NONE SEEN NONE SEEN   Squamous Epithelial / LPF 0-5 0 - 5   Mucus PRESENT   Protein / creatinine ratio, urine   Collection Time: 03/28/21  3:30 PM  Result Value Ref Range   Creatinine, Urine 56 mg/dL   Total Protein, Urine <6 mg/dL   Protein Creatinine Ratio        0.00 - 0.15 mg/mg[Cre]  CBC   Collection Time: 03/28/21  3:46 PM  Result Value Ref Range  WBC 8.8 4.0 - 10.5 K/uL   RBC 3.67 (L) 3.87 - 5.11 MIL/uL   Hemoglobin 11.1 (L) 12.0 - 15.0 g/dL   HCT 64.433.2 (L) 03.436.0 - 74.246.0 %   MCV 90.5 80.0 - 100.0 fL   MCH 30.2 26.0 - 34.0 pg   MCHC 33.4 30.0 - 36.0 g/dL   RDW 59.514.3 63.811.5 - 75.615.5 %   Platelets 205 150 - 400 K/uL   nRBC 0.0 0.0 - 0.2 %  Differential   Collection Time: 03/28/21  3:46 PM  Result Value Ref Range   Neutrophils Relative % 86 %   Neutro Abs 7.6 1.7 - 7.7 K/uL   Lymphocytes Relative 7 %   Lymphs Abs 0.6 (L) 0.7 - 4.0 K/uL   Monocytes Relative 6 %   Monocytes Absolute 0.6 0.1 - 1.0 K/uL   Eosinophils Relative 0 %   Eosinophils Absolute 0.0 0.0 - 0.5 K/uL   Basophils Relative 0 %   Basophils Absolute 0.0 0.0 -  0.1 K/uL   Immature Granulocytes 1 %   Abs Immature Granulocytes 0.06 0.00 - 0.07 K/uL  Rapid HIV screen (HIV 1/2 Ab+Ag)   Collection Time: 03/28/21  3:46 PM  Result Value Ref Range   HIV-1 P24 Antigen - HIV24 NON REACTIVE NON REACTIVE   HIV 1/2 Antibodies NON REACTIVE NON REACTIVE   Interpretation (HIV Ag Ab)      A non reactive test result means that HIV 1 or HIV 2 antibodies and HIV 1 p24 antigen were not detected in the specimen.  Comprehensive metabolic panel   Collection Time: 03/28/21  3:46 PM  Result Value Ref Range   Sodium 136 135 - 145 mmol/L   Potassium 3.7 3.5 - 5.1 mmol/L   Chloride 104 98 - 111 mmol/L   CO2 24 22 - 32 mmol/L   Glucose, Bld 120 (H) 70 - 99 mg/dL   BUN 6 6 - 20 mg/dL   Creatinine, Ser 4.330.56 0.44 - 1.00 mg/dL   Calcium 8.9 8.9 - 29.510.3 mg/dL   Total Protein 6.3 (L) 6.5 - 8.1 g/dL   Albumin 3.2 (L) 3.5 - 5.0 g/dL   AST 17 15 - 41 U/L   ALT 13 0 - 44 U/L   Alkaline Phosphatase 91 38 - 126 U/L   Total Bilirubin 0.5 0.3 - 1.2 mg/dL   GFR, Estimated >18>60 >84>60 mL/min   Anion gap 8 5 - 15  Type and screen [facility]   Collection Time: 03/28/21  3:46 PM  Result Value Ref Range   ABO/RH(D) O NEG    Antibody Screen NEG    Sample Expiration      03/31/2021,2359 Performed at [facility], [address] [place], [place] [address]       Constitutional: NAD, AAOx3  HE/ENT: extraocular movements grossly intact, moist mucous membranes CV: RRR PULM: nl respiratory effort, CTABL     Abd: gravid, non-tender, non-distended, soft      Ext: Non-tender, Nonedematous   Psych: mood appropriate, speech normal Pelvic: SSE done  Pelvic exam: normal external genitalia, vulva, vagina, cervix, uterus and adnexa. Cervix appears closed on SSE. Scant bloody mucus present in vagina, no active bleeding coming from cervix.  Fetal  monitoring: Cat 1  Baseline: 140bpm Variability: moderate Accelerations: present x >2 Decelerations absent  OB  US: "Single living IUP with estimated gestational age of [redacted] weeks 2 days, and US EDC of 05/07/2021.  Limited fetal anatomic evaluation, however visualized fetal anatomy  is unremarkable.  Decreased amniotic fluid volume, with AFI of 2.4 cm.  Normal cervical length.  No placenta previa or abruption identified."  A/P: 31 y.o. approximately 38wks here for antenatal surveillance for vaginal bleeding  Principle Diagnosis: High risk pregnancy in third trimester  Vaginal bleeding: SSE shows scant bloody mucus in vagina, no active bleeding noted, wet prep and gonorrhea/chlamydia swab obtained. Blood type O negative, antibody screen negative, Rhogam IM ordered for vaginal bleeding. No prenatal care: we do not have accurate dating on her pregnancy. She had a normal period in April (07/02/20), then spotting in May and spotting in June. Based on her LMP, she is [redacted]w[redacted]d. Based on OB US ordered today, she is [redacted]w[redacted]d. Ordered OB panel labs. Will consider steroids for lung maturity  Oligohydramnios: US shows AFI 2.4cm. Informed Dr. Jean Rosenthal of oligohydramnios with questionable gestation. Per Dr. Jean Rosenthal, we will order a TSH, Hgb A1C, fasting and 2hr postprandial CBGs, UDS, orally hydrate with 2-4L overnight, and order MFM consult for tomorrow. If any question about ROM, order ROM plus. She denies any leaking fluid, no fluid visualized during speculum exam, not suspicious for ROM at this time.  Labor: not present.  Fetal Wellbeing: Reassuring Cat 1 tracing.  Janyce Llanos, CNM 03/28/2021 6:33 PM

## 2021-03-29 ENCOUNTER — Encounter: Payer: Self-pay | Admitting: Obstetrics and Gynecology

## 2021-03-29 ENCOUNTER — Other Ambulatory Visit: Payer: Self-pay

## 2021-03-29 DIAGNOSIS — O4693 Antepartum hemorrhage, unspecified, third trimester: Secondary | ICD-10-CM | POA: Diagnosis present

## 2021-03-29 DIAGNOSIS — Z23 Encounter for immunization: Secondary | ICD-10-CM | POA: Diagnosis not present

## 2021-03-29 DIAGNOSIS — Z3A34 34 weeks gestation of pregnancy: Secondary | ICD-10-CM | POA: Diagnosis not present

## 2021-03-29 DIAGNOSIS — Z20822 Contact with and (suspected) exposure to covid-19: Secondary | ICD-10-CM | POA: Diagnosis present

## 2021-03-29 DIAGNOSIS — O4103X0 Oligohydramnios, third trimester, not applicable or unspecified: Secondary | ICD-10-CM | POA: Diagnosis present

## 2021-03-29 LAB — GLUCOSE, CAPILLARY: Glucose-Capillary: 131 mg/dL — ABNORMAL HIGH (ref 70–99)

## 2021-03-29 LAB — RESP PANEL BY RT-PCR (FLU A&B, COVID) ARPGX2
Influenza A by PCR: NEGATIVE
Influenza B by PCR: NEGATIVE
SARS Coronavirus 2 by RT PCR: POSITIVE — AB

## 2021-03-29 LAB — RPR: RPR Ser Ql: NONREACTIVE

## 2021-03-29 LAB — HEMOGLOBIN A1C
Hgb A1c MFr Bld: 4.7 % — ABNORMAL LOW (ref 4.8–5.6)
Mean Plasma Glucose: 88.19 mg/dL

## 2021-03-29 MED ORDER — OXYTOCIN-SODIUM CHLORIDE 30-0.9 UT/500ML-% IV SOLN
2.5000 [IU]/h | INTRAVENOUS | Status: DC
  Filled 2021-03-29: qty 500

## 2021-03-29 MED ORDER — DIPHENHYDRAMINE HCL 25 MG PO CAPS: 25.0000 mg | ORAL_CAPSULE | Freq: Four times a day (QID) | ORAL | Status: DC | PRN

## 2021-03-29 MED ORDER — ONDANSETRON HCL 4 MG/2ML IJ SOLN: 4.0000 mg | Freq: Four times a day (QID) | INTRAMUSCULAR | Status: DC | PRN

## 2021-03-29 MED ORDER — BENZOCAINE-MENTHOL 20-0.5 % EX AERO
1.0000 "application " | INHALATION_SPRAY | CUTANEOUS | Status: DC | PRN
  Filled 2021-03-29: qty 56

## 2021-03-29 MED ORDER — PRENATAL MULTIVITAMIN CH
1.0000 | ORAL_TABLET | Freq: Every day | ORAL | Status: DC
  Administered 2021-03-30: 1 via ORAL
  Filled 2021-03-29: qty 1

## 2021-03-29 MED ORDER — OXYTOCIN BOLUS FROM INFUSION
333.0000 mL | Freq: Once | INTRAVENOUS | Status: AC
  Administered 2021-03-29: 333 mL via INTRAVENOUS

## 2021-03-29 MED ORDER — SOD CITRATE-CITRIC ACID 500-334 MG/5ML PO SOLN: 30.0000 mL | ORAL | Status: DC | PRN

## 2021-03-29 MED ORDER — FENTANYL CITRATE (PF) 100 MCG/2ML IJ SOLN: 50.0000 ug | INTRAMUSCULAR | Status: DC | PRN

## 2021-03-29 MED ORDER — DIBUCAINE (PERIANAL) 1 % EX OINT: 1.0000 "application " | TOPICAL_OINTMENT | CUTANEOUS | Status: DC | PRN

## 2021-03-29 MED ORDER — LACTATED RINGERS IV SOLN: 500.0000 mL | INTRAVENOUS | Status: DC | PRN

## 2021-03-29 MED ORDER — PRENATAL MULTIVITAMIN CH: 1.0000 | ORAL_TABLET | Freq: Every day | ORAL | Status: DC

## 2021-03-29 MED ORDER — COCONUT OIL OIL: 1.0000 "application " | TOPICAL_OIL | Status: DC | PRN

## 2021-03-29 MED ORDER — LIDOCAINE HCL (PF) 1 % IJ SOLN
30.0000 mL | INTRAMUSCULAR | Status: DC | PRN
  Filled 2021-03-29: qty 30

## 2021-03-29 MED ORDER — LACTATED RINGERS IV SOLN: INTRAVENOUS | Status: DC

## 2021-03-29 MED ORDER — ONDANSETRON HCL 4 MG/2ML IJ SOLN: 4.0000 mg | INTRAMUSCULAR | Status: DC | PRN

## 2021-03-29 MED ORDER — ACETAMINOPHEN 325 MG PO TABS: 650.0000 mg | ORAL_TABLET | ORAL | Status: DC | PRN

## 2021-03-29 MED ORDER — TETANUS-DIPHTH-ACELL PERTUSSIS 5-2.5-18.5 LF-MCG/0.5 IM SUSY
0.5000 mL | PREFILLED_SYRINGE | Freq: Once | INTRAMUSCULAR | Status: DC
  Filled 2021-03-29: qty 0.5

## 2021-03-29 MED ORDER — ACETAMINOPHEN 325 MG PO TABS
650.0000 mg | ORAL_TABLET | ORAL | Status: DC | PRN
  Administered 2021-03-29: 650 mg via ORAL

## 2021-03-29 MED ORDER — ONDANSETRON HCL 4 MG PO TABS: 4.0000 mg | ORAL_TABLET | ORAL | Status: DC | PRN

## 2021-03-29 MED ORDER — ZOLPIDEM TARTRATE 5 MG PO TABS: 5.0000 mg | ORAL_TABLET | Freq: Every evening | ORAL | Status: DC | PRN

## 2021-03-29 MED ORDER — IBUPROFEN 600 MG PO TABS: 600.0000 mg | ORAL_TABLET | Freq: Four times a day (QID) | ORAL | Status: DC

## 2021-03-29 MED ORDER — SIMETHICONE 80 MG PO CHEW: 80.0000 mg | CHEWABLE_TABLET | ORAL | Status: DC | PRN

## 2021-03-29 MED ORDER — IBUPROFEN 600 MG PO TABS
600.0000 mg | ORAL_TABLET | Freq: Four times a day (QID) | ORAL | Status: DC
  Administered 2021-03-29 – 2021-03-30 (×5): 600 mg via ORAL
  Filled 2021-03-29 (×5): qty 1

## 2021-03-29 MED ORDER — MEASLES, MUMPS & RUBELLA VAC IJ SOLR
0.5000 mL | Freq: Once | INTRAMUSCULAR | Status: AC
  Administered 2021-03-30: 0.5 mL via SUBCUTANEOUS
  Filled 2021-03-29 (×2): qty 0.5

## 2021-03-29 MED ORDER — SENNOSIDES-DOCUSATE SODIUM 8.6-50 MG PO TABS
2.0000 | ORAL_TABLET | Freq: Every day | ORAL | Status: DC
  Administered 2021-03-30: 2 via ORAL
  Filled 2021-03-29: qty 2

## 2021-03-29 MED ORDER — WITCH HAZEL-GLYCERIN EX PADS: 1.0000 "application " | MEDICATED_PAD | CUTANEOUS | Status: DC | PRN

## 2021-03-29 MED ORDER — ACETAMINOPHEN 325 MG PO TABS
650.0000 mg | ORAL_TABLET | ORAL | Status: DC | PRN
  Filled 2021-03-29: qty 2

## 2021-03-29 NOTE — Discharge Summary (Addendum)
Obstetrical Discharge Summary  Patient Name: Melanie Dominguez DOB: December 05, 1990 MRN: [id number]  Date of Admission: 03/28/2021 Date of Delivery: 03/28/21 Delivered by: Donato Schultz, CNM Date of Discharge: 03/30/2021  Primary OB: No Prenatal care [id number] last menstrual period was 07/02/2020. EDC Estimated Date of Delivery: 05/07/21 Gestational Age at Delivery: [redacted]w[redacted]d   Antepartum complications: no prenatal care, uncertain dating Admitting Diagnosis: Labor Secondary Diagnosis: Patient Active Problem List   Diagnosis Date Noted   NSVD (normal spontaneous vaginal delivery) 03/30/2021   Preterm labor 03/29/2021   Labor and delivery, indication for care 03/19/2018   Elevated liver enzymes    Elevated bilirubin    Hyperemesis gravidarum with metabolic disturbance 12/09/2014    Augmentation: N/A Complications: None Intrapartum complications/course: She started contracting around 1815, contractions became strong and intense. SROM around 0330, NSVD of vigorous viable female infant @ 44 Date of Delivery: 03/29/21 Delivered By: Donato Schultz, CNM Delivery Type: spontaneous vaginal delivery Anesthesia: epidural Placenta: spontaneous Laceration: none Episiotomy: none Newborn Data: Live born female  Birth Weight: 6 lb 6.3 oz (2900 g) APGAR: 8, 9  Newborn Delivery   Birth date/time: 03/29/2021 07:04:00 Delivery type: Vaginal, Spontaneous        Postpartum Procedures: TOC consult [place]: No flowsheet data found.    Post partum course:  Patient had an uncomplicated postpartum course.  By time of discharge on PPD#1, her pain was controlled on oral pain medications; she had appropriate lochia and was ambulating, voiding without difficulty and tolerating regular diet.  She was deemed stable for discharge to home.    Discharge Physical Exam:  BP 116/72 (BP Location: Left Arm)    Pulse 79    Temp 98.4 F (36.9 C) (Oral)    Resp 18    Ht 5\' 1"  (1.549 m)    Wt 60 kg    LMP  07/02/2020    SpO2 100%    Breastfeeding Unknown    BMI 24.99 kg/m   General: NAD CV: RRR Pulm: CTABL, nl effort ABD: s/nd/nt, fundus firm and below the umbilicus Lochia: moderate Perineum: well approximated/intact DVT Evaluation: LE non-ttp, no evidence of DVT on exam.  Hemoglobin  Date Value Ref Range Status  03/30/2021 10.7 (L) 12.0 - 15.0 g/dL Final   HCT  Date Value Ref Range Status  03/30/2021 32.8 (L) 36.0 - 46.0 % Final     Disposition: stable, discharge to home. Baby Feeding: breastmilk and formula Baby Disposition: home with mom  Rh Immune globulin given: Rhogam given before d/c Rubella vaccine given: Immune Varicella vaccine given: status unknown Tdap vaccine given in AP or PP setting: to be offered before discharge Flu vaccine given in AP or PP setting: to be offered before discharge  Contraception: TBD  Prenatal Labs:  Blood type/Rh O neg (never received Rhogam)  Antibody screen neg  Rubella Immune  Varicella Not collected  RPR NR  HBsAg Neg  HIV NR  GC neg  Chlamydia neg  Genetic screening negative  1 hour GTT Not performed  3 hour GTT Not performed  GBS Negative by PCR     Plan:  05/28/2021 was discharged to home in good condition. Follow-up appointment with delivering provider in 6 weeks.  Discharge Medications: Allergies as of 03/30/2021   No Known Allergies      Medication List     TAKE these medications    acetaminophen 325 MG tablet Commonly known as: Tylenol Take 2 tablets (650 mg total) by mouth every 4 (  four) hours as needed (for pain scale < 4).   ibuprofen 600 MG tablet Commonly known as: ADVIL Take 1 tablet (600 mg total) by mouth every 6 (six) hours.   prenatal multivitamin Tabs tablet Take 1 tablet by mouth daily at 12 noon.          Follow-up Information     Janyce Llanos, CNM. Schedule an appointment as soon as possible for a visit in 6 week(s).   Specialty: Certified Nurse  Midwife Contact information: [address] [place] [address] [phone]                 Signed:  Gregery Na 03/30/2021 8:50 AM

## 2021-03-29 NOTE — Progress Notes (Signed)
Infant is vigorous. Assessed by NNP who states she believes infant acts and appears similarly to a 38 week infant rather than a 34 week infant. Discussed pregnancy dating options with Dr. Jean Rosenthal who advises the best dating we have for her pregnancy is the [place] yesterday which shows 34wks so to continue her gestational age as [redacted]w[redacted]d.

## 2021-03-30 LAB — FETAL SCREEN: Fetal Screen: NEGATIVE

## 2021-03-30 LAB — CBC
HCT: 32.8 % — ABNORMAL LOW (ref 36.0–46.0)
Hemoglobin: 10.7 g/dL — ABNORMAL LOW (ref 12.0–15.0)
MCH: 30.2 pg (ref 26.0–34.0)
MCHC: 32.6 g/dL (ref 30.0–36.0)
MCV: 92.7 fL (ref 80.0–100.0)
Platelets: 238 10*3/uL (ref 150–400)
RBC: 3.54 MIL/uL — ABNORMAL LOW (ref 3.87–5.11)
RDW: 14.8 % (ref 11.5–15.5)
WBC: 10.3 10*3/uL (ref 4.0–10.5)
nRBC: 0 % (ref 0.0–0.2)

## 2021-03-30 LAB — URINE CULTURE

## 2021-03-30 LAB — RUBELLA SCREEN: Rubella: 10.6 index (ref >0.99)

## 2021-03-30 MED ORDER — INFLUENZA VAC SPLIT QUAD 0.5 ML IM SUSY
0.5000 mL | PREFILLED_SYRINGE | INTRAMUSCULAR | Status: DC | PRN
  Filled 2021-03-30: qty 0.5

## 2021-03-30 MED ORDER — TETANUS-DIPHTH-ACELL PERTUSSIS 5-2.5-18.5 LF-MCG/0.5 IM SUSY
0.5000 mL | PREFILLED_SYRINGE | Freq: Once | INTRAMUSCULAR | Status: AC
  Administered 2021-03-30: 0.5 mL via INTRAMUSCULAR
  Filled 2021-03-30: qty 0.5

## 2021-03-30 MED ORDER — RHO D IMMUNE GLOBULIN 1500 UNIT/2ML IJ SOSY
300.0000 ug | PREFILLED_SYRINGE | Freq: Once | INTRAMUSCULAR | Status: AC
  Administered 2021-03-30: 300 ug via INTRAMUSCULAR
  Filled 2021-03-30: qty 2

## 2021-03-30 NOTE — TOC Progression Note (Signed)
Transition of Care [facility]) - Progression Note    Patient Details  Name: Melanie Dominguez MRN: [id number] Date of Birth: [birth year]  Transition of [facility]) CM/SW Contact  Shelbie Hutching, RN Phone Number: 03/30/2021, 11:02 AM  Clinical Narrative:    [facility] consult acknowledged for no prenatal care.  RNCM met with the patient and her husband at the bedside along with the [place] interpreter.  RNCM introduced self and explained role.  Patient's husband is at the bedside.   Patient lives with her husband and 2 other children.  Patient verbalizes that she did not get prenatal care but she did take her prenatal vitamins, she was trying to get in with a clinic and actually had an appointment for tomorrow but it was too late.  Patient reports that she and her family are excited and happy about the new baby, she will be taking the baby to the [facility] in [place] for pediatric care.  She has a car seat for the baby to ride home in.  Husband drives and will provide transportation.    No additional needs identified.  Patient set for discharge home today.         Expected Discharge Plan and Services           Expected Discharge Date: 03/30/21                                     Social Determinants of Health (SDOH) Interventions    Readmission Risk Interventions No flowsheet data found.

## 2021-03-30 NOTE — Progress Notes (Signed)
Pt discharged with infant, discharge instructions, prescriptions, and follow up appointments given to and reviewed with pt with interpreter at bedside. Pt verbalized understanding. Escorted by staff to personal vehicle.

## 2021-03-31 ENCOUNTER — Encounter: Payer: Self-pay | Admitting: Physician Assistant

## 2021-03-31 LAB — RHOGAM INJECTION: Unit division: 0

## 2021-03-31 NOTE — Progress Notes (Unsigned)
Reviewed 1/9//23 and 03/29/21 [facility] records of eval/admission to labor & delivery unit for 31 yo G3P2002 presenting with vag bleeding and uterine contractions in setting of no prenatal care at 34 2/7 wks. Anticipate vaginal delivery.

## 2022-04-09 IMAGING — US US OB COMP +14 WK
1 series · 15 of 28 positions shown · non-contrast
Comparison: none

CLINICAL DATA: No prenatal care. Vaginal discharge and bleeding in
3rd trimester.

EXAM:
OBSTETRICAL ULTRASOUND >14 WKS

[Series 1: us ob comp + 14 wk · 15 of 77 slices shown]
[im 1/77]
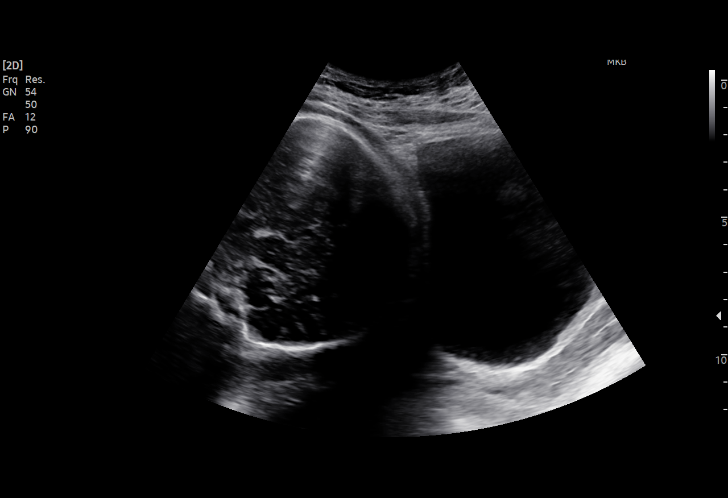
[im 6/77]
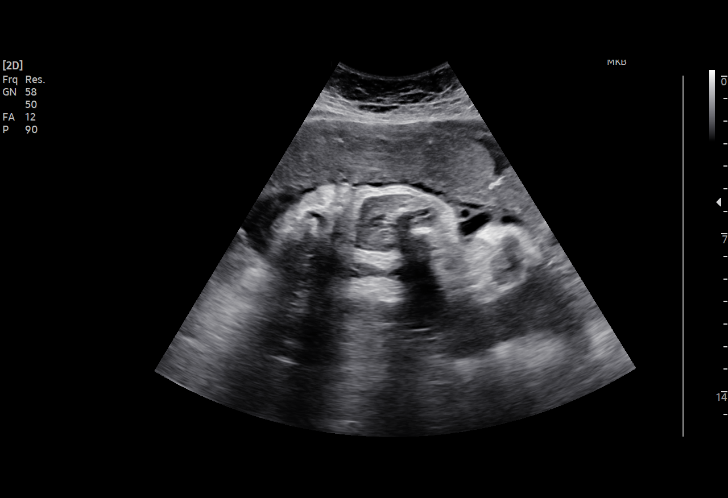
[im 12/77]
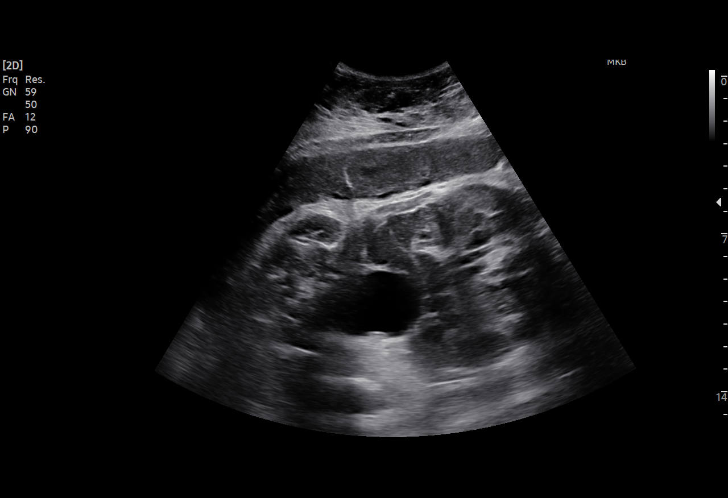
[im 17/77]
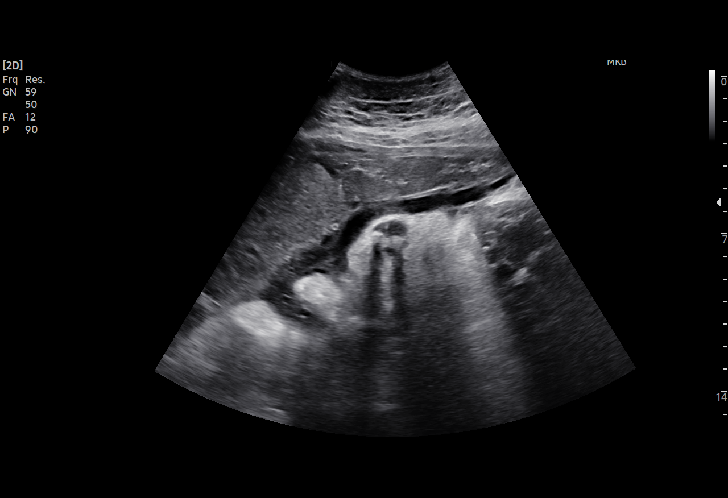
[im 23/77]
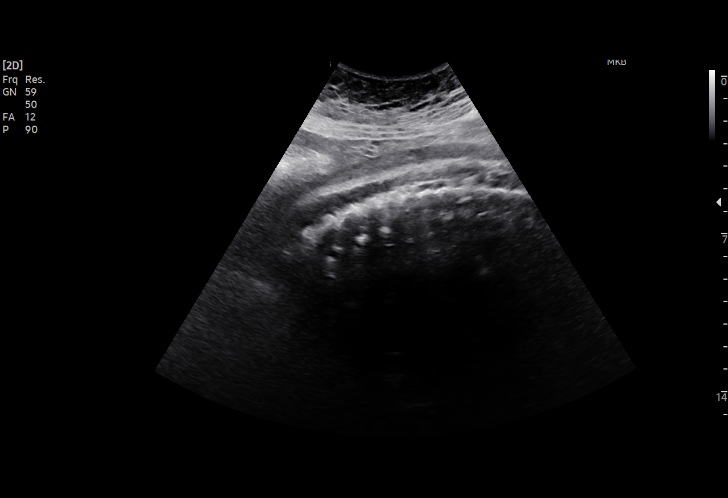
[im 29/77]
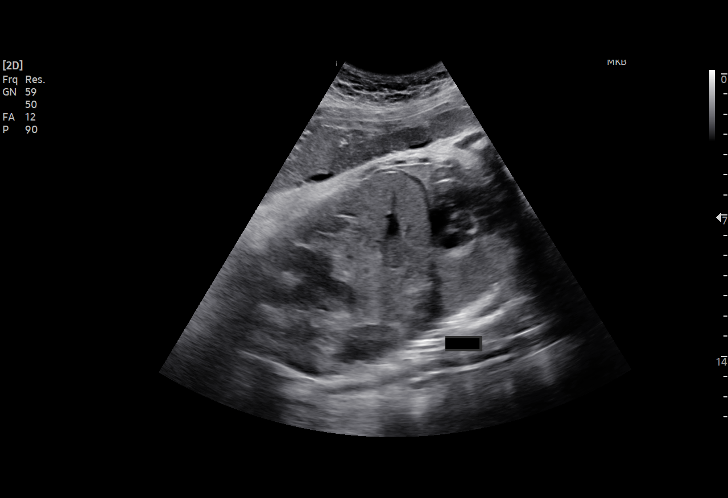
[im 34/77]
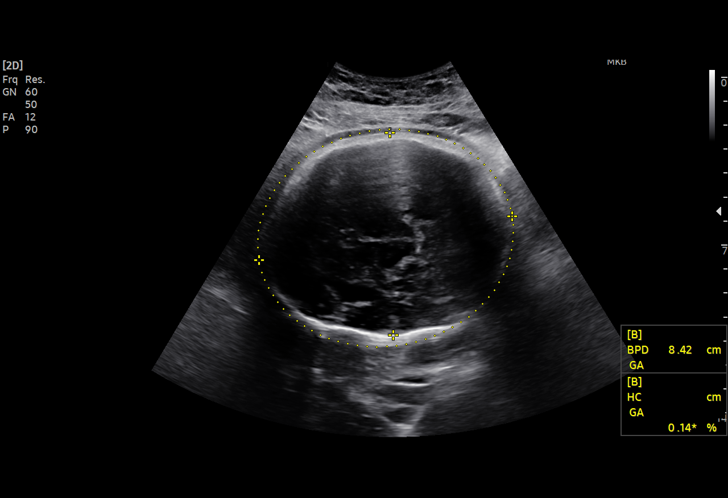
[im 40/77]
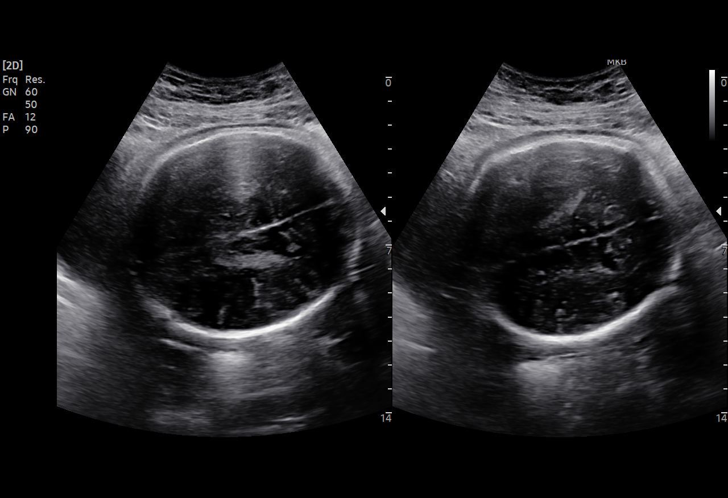
[im 43/77]
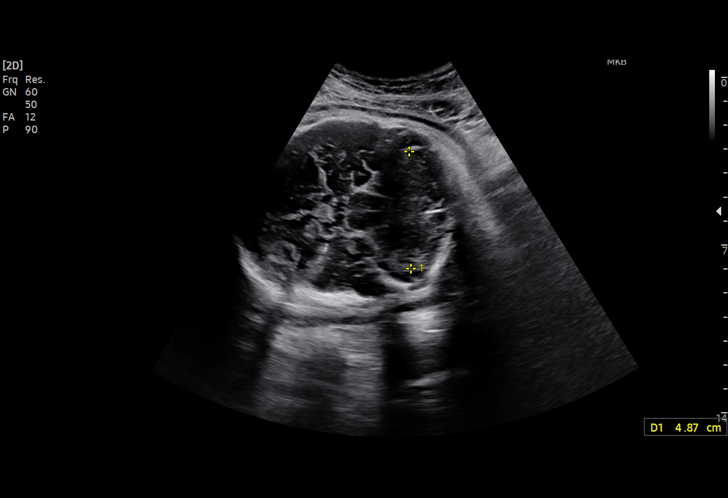
[im 48/77]
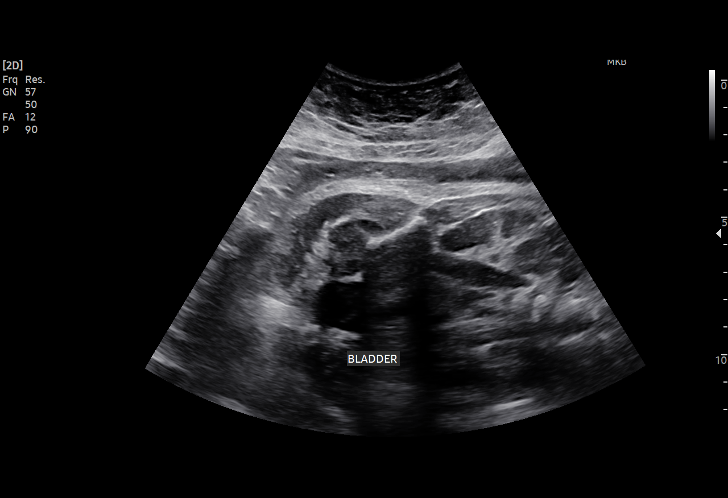
[im 54/77]
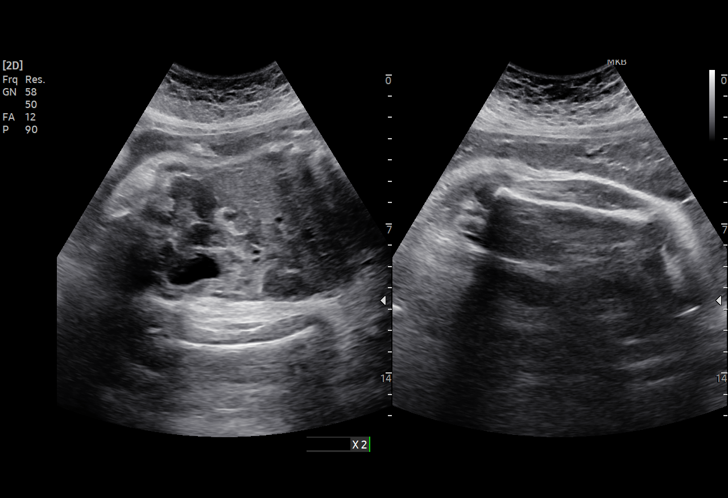
[im 60/77]
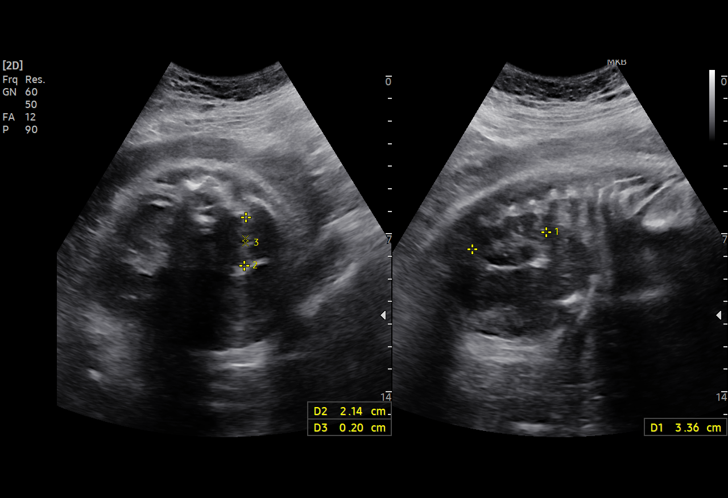
[im 65/77]
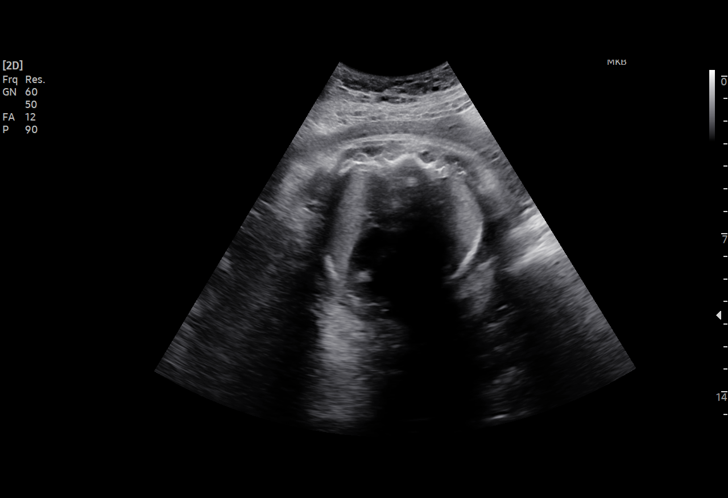
[im 71/77]
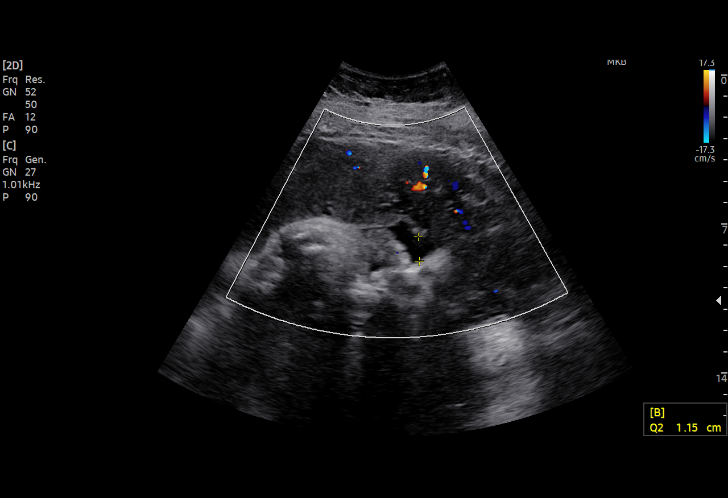
[im 77/77]
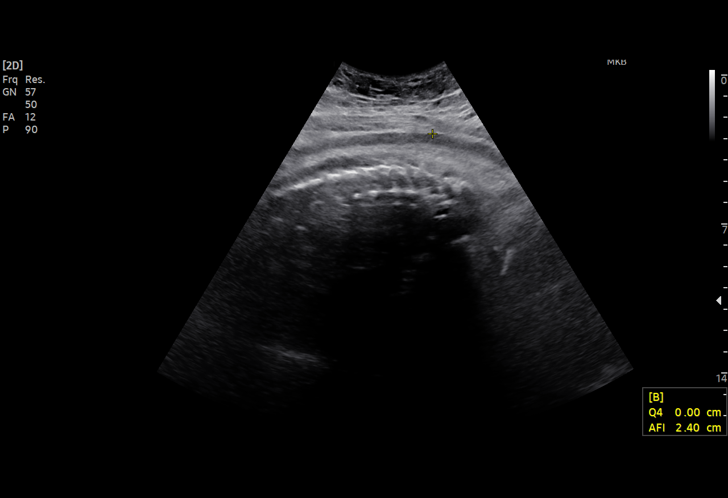

[15 of 28 positions shown; findings below may reference images not displayed]

FINDINGS: Number of Fetuses: 1

Heart Rate:  156 bpm

Movement: Yes

Presentation: Cephalic

Previa: No

Placental Location: Anterior

Amniotic Fluid (Subjective): Decreased

Amniotic Fluid (Objective):

AFI = 2.4 cm (5%ile= 7.0 cm, 95%= 24.8 cm for 34 wks)

FETAL BIOMETRY

BPD: 8.4cm 33w 6d

HC:   31.0cm 34w 5d

AC:   31.5cm 35w 3d

FL:   6.9cm 35w 2d

Current Mean GA: 34Angi Billiot US EDC: 05/07/2021

Estimated Fetal Weight:  2,606g

FETAL ANATOMY

Lateral Ventricles: Appears normal

Thalami/CSP: Appears normal

Posterior Fossa:  Appears normal

Nuchal Region: Not visualized   NFT= N/A > 20 WKS

Upper Lip: Not visualized

Spine: Not visualized

4 Chamber Heart on Left: Appears normal

LVOT: Not visualized

RVOT: Not visualized

Stomach on Left: Appears normal

3 Vessel Cord: Not visualized

Cord Insertion site: Not visualized

Kidneys: Appears normal

Bladder: Appears normal

Extremities: Appears normal

Sex: Not Visualized

Technically difficult due to: Advanced gestational age, fetal
position, and decreased amniotic fluid

Maternal Findings:

Cervix:  3.7 cm TA
IMPRESSION: Single living IUP with estimated gestational age of 34 weeks 2 days,
and US EDC of 05/07/2021.

Limited fetal anatomic evaluation, however visualized fetal anatomy
is unremarkable.

Decreased amniotic fluid volume, with AFI of 2.4 cm.

Normal cervical length.  No placenta previa or abruption identified.
